# Patient Record
Sex: Female | Born: 1985 | Race: Black or African American | Hispanic: No | Marital: Single | Smoking: Never smoker
Health system: Southern US, Community
[De-identification: ages and names within clinical notes are randomized; demographics above are authoritative.]

## PROBLEM LIST (undated history)

## (undated) DIAGNOSIS — R87810 Cervical high risk human papillomavirus (HPV) DNA test positive: Secondary | ICD-10-CM

## (undated) DIAGNOSIS — F419 Anxiety disorder, unspecified: Secondary | ICD-10-CM

## (undated) DIAGNOSIS — F32A Depression, unspecified: Secondary | ICD-10-CM

## (undated) DIAGNOSIS — R87619 Unspecified abnormal cytological findings in specimens from cervix uteri: Secondary | ICD-10-CM

## (undated) DIAGNOSIS — E079 Disorder of thyroid, unspecified: Secondary | ICD-10-CM

## (undated) DIAGNOSIS — A64 Unspecified sexually transmitted disease: Secondary | ICD-10-CM

## (undated) DIAGNOSIS — N946 Dysmenorrhea, unspecified: Secondary | ICD-10-CM

## (undated) DIAGNOSIS — L630 Alopecia (capitis) totalis: Secondary | ICD-10-CM

## (undated) DIAGNOSIS — F329 Major depressive disorder, single episode, unspecified: Secondary | ICD-10-CM

## (undated) DIAGNOSIS — D563 Thalassemia minor: Secondary | ICD-10-CM

## (undated) HISTORY — DX: Alopecia (capitis) totalis: L63.0

## (undated) HISTORY — DX: Depression, unspecified: F32.A

## (undated) HISTORY — DX: Cervical high risk human papillomavirus (HPV) DNA test positive: R87.810

## (undated) HISTORY — DX: Major depressive disorder, single episode, unspecified: F32.9

## (undated) HISTORY — DX: Thalassemia minor: D56.3

## (undated) HISTORY — DX: Unspecified sexually transmitted disease: A64

## (undated) HISTORY — DX: Disorder of thyroid, unspecified: E07.9

## (undated) HISTORY — DX: Unspecified abnormal cytological findings in specimens from cervix uteri: R87.619

## (undated) HISTORY — DX: Dysmenorrhea, unspecified: N94.6

---
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
## 2005-09-18 ENCOUNTER — Emergency Department (HOSPITAL_COMMUNITY): Admission: EM | Admit: 2005-09-18 | Discharge: 2005-09-19 | Payer: Self-pay | Admitting: Emergency Medicine

## 2005-09-24 ENCOUNTER — Other Ambulatory Visit: Admission: RE | Admit: 2005-09-24 | Discharge: 2005-09-24 | Payer: Self-pay | Admitting: Obstetrics and Gynecology

## 2006-01-01 ENCOUNTER — Other Ambulatory Visit: Admission: RE | Admit: 2006-01-01 | Discharge: 2006-01-01 | Payer: Self-pay | Admitting: Obstetrics & Gynecology

## 2006-03-04 ENCOUNTER — Inpatient Hospital Stay (HOSPITAL_COMMUNITY): Admission: AD | Admit: 2006-03-04 | Discharge: 2006-03-04 | Payer: Self-pay | Admitting: Obstetrics & Gynecology

## 2006-03-10 ENCOUNTER — Emergency Department (HOSPITAL_COMMUNITY): Admission: EM | Admit: 2006-03-10 | Discharge: 2006-03-10 | Payer: Self-pay | Admitting: Emergency Medicine

## 2006-03-10 ENCOUNTER — Encounter: Payer: Self-pay | Admitting: Obstetrics & Gynecology

## 2006-08-05 ENCOUNTER — Inpatient Hospital Stay (HOSPITAL_COMMUNITY): Admission: AD | Admit: 2006-08-05 | Discharge: 2006-08-05 | Payer: Self-pay | Admitting: Obstetrics and Gynecology

## 2006-08-06 ENCOUNTER — Encounter (INDEPENDENT_AMBULATORY_CARE_PROVIDER_SITE_OTHER): Payer: Self-pay | Admitting: Specialist

## 2006-08-06 ENCOUNTER — Inpatient Hospital Stay (HOSPITAL_COMMUNITY): Admission: AD | Admit: 2006-08-06 | Discharge: 2006-08-10 | Payer: Self-pay | Admitting: Obstetrics and Gynecology

## 2007-09-15 ENCOUNTER — Emergency Department (HOSPITAL_COMMUNITY): Admission: EM | Admit: 2007-09-15 | Discharge: 2007-09-15 | Payer: Self-pay | Admitting: Emergency Medicine

## 2007-09-17 HISTORY — PX: HERNIA REPAIR: SHX51

## 2008-11-13 ENCOUNTER — Emergency Department (HOSPITAL_COMMUNITY): Admission: EM | Admit: 2008-11-13 | Discharge: 2008-11-13 | Payer: Self-pay | Admitting: Emergency Medicine

## 2011-01-01 LAB — URINE CULTURE: Colony Count: >=100000

## 2011-01-01 LAB — POCT I-STAT, CHEM 8
BUN: 11 mg/dL (ref 6–23)
Chloride: 108 mEq/L (ref 96–112)
Creatinine, Ser: 0.8 mg/dL (ref 0.4–1.2)
Glucose, Bld: 99 mg/dL (ref 70–99)
Potassium: 3.4 mEq/L — ABNORMAL LOW (ref 3.5–5.1)
Sodium: 142 mEq/L (ref 135–145)

## 2011-01-01 LAB — COMPREHENSIVE METABOLIC PANEL
Albumin: 4.3 g/dL (ref 3.5–5.2)
Alkaline Phosphatase: 115 U/L (ref 39–117)
BUN: 10 mg/dL (ref 6–23)
Calcium: 9.7 mg/dL (ref 8.4–10.5)
Glucose, Bld: 104 mg/dL — ABNORMAL HIGH (ref 70–99)
Potassium: 3.4 mEq/L — ABNORMAL LOW (ref 3.5–5.1)
Total Protein: 7.9 g/dL (ref 6.0–8.3)

## 2011-01-01 LAB — DIFFERENTIAL
Lymphocytes Relative: 3 % — ABNORMAL LOW (ref 12–46)
Lymphs Abs: 0.3 10*3/uL — ABNORMAL LOW (ref 0.7–4.0)
Monocytes Absolute: 0.2 10*3/uL (ref 0.1–1.0)
Monocytes Relative: 1 % — ABNORMAL LOW (ref 3–12)
Neutro Abs: 11.7 10*3/uL — ABNORMAL HIGH (ref 1.7–7.7)
Neutrophils Relative %: 96 % — ABNORMAL HIGH (ref 43–77)

## 2011-01-01 LAB — CBC
HCT: 38.3 % (ref 36.0–46.0)
MCHC: 31.1 g/dL (ref 30.0–36.0)
Platelets: 237 10*3/uL (ref 150–400)
RDW: 15.7 % — ABNORMAL HIGH (ref 11.5–15.5)

## 2011-01-01 LAB — URINALYSIS, ROUTINE W REFLEX MICROSCOPIC
Glucose, UA: NEGATIVE mg/dL
Ketones, ur: >80 mg/dL — AB
Nitrite: NEGATIVE
Specific Gravity, Urine: 1.031 — ABNORMAL HIGH (ref 1.005–1.030)
pH: 6 (ref 5.0–8.0)

## 2011-01-01 LAB — POCT PREGNANCY, URINE: Preg Test, Ur: NEGATIVE

## 2011-01-01 LAB — URINE MICROSCOPIC-ADD ON

## 2011-02-01 NOTE — Discharge Summary (Signed)
Kristina Arnold, LIVECCHI           ACCOUNT NO.:  [id number]   MEDICAL RECORD NO.:  [id number]          PATIENT TYPE:  INP   LOCATION:  9119                          FACILITY:  [facility]   PHYSICIAN:  Kendra H. Tenny Craw, MD     DATE OF BIRTH:  [birth year]   DATE OF ADMISSION:  08/06/2006  DATE OF DISCHARGE:  08/10/2006                               DISCHARGE SUMMARY   FINAL DIAGNOSES:  1. Intrauterine pregnancy at 38-6/7th's weeks' gestation.  2. Non-reassuring fetal well being.  3. Maternal fever, probable chorioamnionitis.  4. Latent phase of labor and thick meconium-stained amniotic fluid.   PROCEDURE:  Primary low segment transverse cesarean section.  Surgeon:  Dr. Conley Simmonds.  Assistant:  Dr. Ilda Mori.   COMPLICATIONS:  None.     This 25 year old, G1, P0 presents at 38-6/7th's weeks' gestation with  increase in uterine contractions.  The patient had been seen in the  office on November20, 2007, with a complaint of fever and decreased  fetal movement.  The patient in the office had a BPP performed which was  8/8, but did have a nonreactive stress test and was sent to the hospital  for continuous monitoring.  In the hospital, the patient was had a CBC  performed with a white count of 26,000.  She was diagnosed with a viral  syndrome and sent home.  She returned early the next morning complaining  of increased contractions with increase in her temperature that would  decrease with Tylenol administration.  Up to this point in the pregnancy  though, the patient's antepartum course had been uncomplicated.  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  She is also known to be group B strep positive.  Otherwise,  she had an easy antepartum course up to this point.  Upon admission to  the hospital, the patient's cervix was only 1-to-2-cm dilated.  She had  a repeat  white blood cell count that was 28,000, and fetal heart rate  tracings that showed some fetal tachycardia with decreased beat to beat  variability.  She was sent down to labor and delivery for further  evaluation.  She received IV fluids, AROM was performed with thick  meconium-stained amniotic fluid noted.  IUPCs were placed.  At this  point, the fetal heart rate tracing had decreased variability and  nonreactive.  Secondary to this and being remote from labor, the  decision was made to proceed with a primary cesarean section.   The patient was taken to the operating room on November21,2007, by  Dr. Conley Simmonds, where a primary low transverse cesarean section was  performed with the delivery of a 7 pounds 4 ounces female infant with  Apgar's of 5 and 9, an arterial cord pH came back 7.26.  The delivery  went without complications.  The patient was continued on antibiotics  secondary to her chorioamnionitis and fevers.  The baby was sent to the  NICU for suspected sepsis.  Postoperatively,  the patient continued to  have fever which eventually started resolving.  Later on postoperative  day number zero, she was switched over to ampicillin and gentamicin.  On  postoperative day #1, white blood cell count was still elevated.  By  postoperative day #2, the patient was feeling much better.  White blood  cell count decreased back down to 23,000.  She was afebrile for 24  hours.  At this point, she was continued on antibiotics. By  postoperative day #3, she was doing well.  She did want her little boy  circumcised but at this point he was in the NICU, so that could not be  done at this point.  Her antibiotics were able to be discontinued.  She  was felt ready for discharge on postoperative day #2.   She did desire Depo-Provera injection before discharge.  This was given  to the patient.  She was given Percocet one to two every four to six  hours as needed for pain, told to continue on iron  supplement and to use  Colace as needed.  Precautions were reviewed and of course the patient  was to call with any increased bleeding, pain, or problems.  She was to  follow up in our office in four to six weeks.  The baby was still in  NICU at the time of discharge.   DISCHARGE LABORATORY:  The patient had a final hemoglobin of 8.2, which  was down from a preoperative level of 10.2 and a white blood cell count  of 17,000 which was down from a high of 31,000 on the 22nd, and  platelets of 263,000.      Leilani Able, P.A.-C.      Freddrick March. Tenny Craw, MD  Electronically Signed    MB/MEDQ  D:  09/13/2006  T:  09/13/2006  Job:  [id number]

## 2011-02-01 NOTE — Op Note (Signed)
NAMEVIRGILENE, STRYKER NO.:  [id number]   MEDICAL RECORD NO.:  [id number]          PATIENT TYPE:  INP   LOCATION:  9139                          FACILITY:  [facility]   PHYSICIAN:  Randye Lobo, M.D.   DATE OF BIRTH:  05/06/1986   DATE OF PROCEDURE:  08/06/2006  DATE OF DISCHARGE:                               OPERATIVE REPORT   PREOPERATIVE DIAGNOSES:  1. Intrauterine gestation at 38+6 weeks.  2. Nonreassuring fetal assessment.  3. Latent phase labor.  4. Thick meconium stained amniotic fluid.  5. Fever of unknown origin.   POSTOPERATIVE DIAGNOSES:  1. Intrauterine gestation at 38+6 weeks.  2. Nonreassuring fetal assessment.  3. Latent phase labor.  4. Thick meconium stained amniotic fluid.  5. Fever of unknown origin.   PROCEDURE:  Primary low segment transverse cesarean section.   SURGEON:  Randye Lobo, M.D.   ASSISTANT:  Ilda Mori, M.D.   ANESTHESIA:  Spinal.   ESTIMATED BLOOD LOSS:  700 mL.   COMPLICATIONS:  None.   INDICATIONS FOR PROCEDURE:  The patient is a 25 year old gravida 1, para  0 African American female at 38+[redacted] weeks gestation ([facility] August 14, 2006) who presented with uterine contractions.  The patient had been  seen in the office on August 05, 2006, with a complaint of fever to  101.4 along with a complaint of decreased fetal movement.  The patient,  in the office, had a nonreactive nonstress test and she had a  biophysical profile which was 8/8.  The patient was subsequently sent to  the [facility] for a CBC and she was noted to have a  white count of 26,000.  The patient was diagnosed with a viral syndrome  and was sent home.  The patient returned early in the morning on  August 06, 2006, with a complaint of contractions.  She continued to  report spiking fevers which would come and go with Tylenol  administration.  The patient was noted to have cervical dilation which  was 1-2 cm.  She had some  bloody show after her exam.  The patient had a  repeat white blood cell count which was 28,000.  The fetal heart rate  tracing showed fetal tachycardia with decreased beat to beat  variability.  The patient did have a positive group B strep status.   The patient was sent down to labor and delivery for further evaluation.  The patient did have an IV which had been placed in the maternity  admissions area and she received IV fluid boluses.  Once on labor and  delivery, artificial rupture of membranes was performed and thick  meconium stained amniotic fluid was appreciated.  An internal scalp  electrode and an IUPC were placed at this time.  After a short period of  time, it was apparent that the fetal heart rate tracing had decreased  beat to beat variability and was nonreactive.  Due to the stage of labor  being remote from delivery, a decision was made to go ahead and proceed  with a primary cesarean section based on recommendation of the  obstetrician.  Risks, benefits, and alternatives were reviewed.   FINDINGS:  A viable female was delivered at 8:36 a.m.  Apgars were 5 at  one minute and 9 at five minutes.  Weight 7 pounds 4 ounces.  Amniotic  fluid thick meconium.  Normal uterus, tubes and ovaries.  Placenta with  meconium staining.  Normal insertion of a three-vessel cord.  Arterial  cord pH 7.26.   SPECIMENS:  The placenta was sent to pathology.   DESCRIPTION OF PROCEDURE:  The patient was escorted from her labor and  delivery suite to the operating room.  The patient received a spinal  anesthetic.  Fetal heart tones were in the 160s prior to beginning the  procedure.   The patient was placed in the supine position with a left lateral tilt  and the abdomen was then sterilely prepped and a Foley catheter was  sterilely placed in the bladder.  She was sterilely draped.   A Pfannenstiel incision was created sharply with the scalpel.  The  incision was carried down to the fascia  using a scalpel and using  monopolar cautery.  The fascia was then incised in the midline.  The  incision was extended bilaterally with the Mayo scissors.  The rectus  muscles were separated from the fascia superiorly and inferiorly.  The  rectus muscles were sharply divided in the midline.  The parietal  peritoneum was then entered sharply after elevating it with two hemostat  clamps.   The lower uterine segment was exposed.  The bladder flap was created.  A  transverse lower uterine segment incision was created with a scalpel and  then entry into the uterine cavity was performed with a hemostat clamp.  Membranes were ruptured.  A bandage scissors was used to extend the  uterine incision bilaterally in an  upward fashion.   Thick meconium was appreciated.  A hand was inserted over the vertex  which was elevated.  A Mityvac was then placed over the fetal vertex and  with one attempt, the vertex was then delivered without difficulty.  The  nares and mouth were then suctioned with a DeLee.  The newborn began to  have a spontaneous cry.  The remainder of the newborn was delivered  after one nuchal cord was reduced.  The cord was doubly clamped and cut  and the newborn was carried over to the awaiting pediatricians.   The patient had just finished a dose of penicillin 5,000,000 units IV  prior to beginning the surgery.  The placenta was manually extracted and  sent to pathology.  The patient then received Pitocin 20 units IV.  The  uterus was wiped clean with a moistened lap pad.   The uterus was exteriorized for its closure.  It was closed with a  double layered closure of #1 chromic.  The first was a running locked  layer and the second was an imbricating layer.  The uterus was then  returned to the peritoneal cavity which was irrigated and suctioned.  There was some bleeding noted along the right apex of the uterine incision and this responded to a figure-of-eight suture of #1  chromic.   With good hemostasis, the abdomen was closed at this time.  The  peritoneum was closed with a running suture of 2-0 Vicryl.  The rectus  muscles were reapproximated with interrupted sutures of #1 chromic. The  fascia was closed with a running suture of 0 Vicryl.  The subcutaneous  layer was irrigated  and suctioned and made hemostatic with monopolar  cautery.  The skin was closed with staples and a sterile bandage was  placed over the incision.   This concludes the patient's procedure.  There were no complications.  All sponge, needle and instrument counts were correct.      Randye Lobo, M.D.  Electronically Signed     BES/MEDQ  D:  08/06/2006  T:  08/06/2006  Job:  [id number]

## 2011-06-21 LAB — URINE MICROSCOPIC-ADD ON

## 2011-06-21 LAB — URINALYSIS, ROUTINE W REFLEX MICROSCOPIC
Bilirubin Urine: NEGATIVE
Specific Gravity, Urine: 1.023
Urobilinogen, UA: 1
pH: 7

## 2011-06-21 LAB — URINE CULTURE

## 2011-09-14 ENCOUNTER — Emergency Department
Admission: EM | Admit: 2011-09-14 | Discharge: 2011-09-14 | Disposition: A | Discharge status: Home / Self Care | Payer: 59 | Source: Home / Self Care | Attending: Family Medicine | Admitting: Family Medicine

## 2011-09-14 DIAGNOSIS — M940 Chondrocostal junction syndrome [Tietze]: Secondary | ICD-10-CM

## 2011-09-14 DIAGNOSIS — R079 Chest pain, unspecified: Secondary | ICD-10-CM

## 2011-09-14 MED ORDER — HYDROCODONE-ACETAMINOPHEN 5-500 MG PO TABS: 1.0000 | ORAL_TABLET | Freq: Every evening | ORAL | Status: AC | PRN

## 2011-09-14 NOTE — ED Notes (Signed)
States she had a cold three weeks ago when she first noticed discomfort in chest.  States she feels pinching feeling in chest with deep breaths.  States it keeps her from sleeping at night

## 2011-09-14 NOTE — ED Provider Notes (Signed)
History     CSN: [id number]  Arrival date & time 09/14/11  1253   First MD Initiated Contact with Patient 09/14/11 1603      Chief Complaint  Patient presents with  . Chest Pain      HPI Comments: Patient complains of onset of anterior chest pain and tightness about a week ago.  The pain is worse with movement and deep inspiration.  She feels well otherwise. She reports that she had a cold about 3 weeks ago which is almost completely resolved except for occasional cough.  No pleuritic pain, and no fevers, chills, and sweats   The history is provided by the patient.    History reviewed. No pertinent past medical history.  Past Surgical History  Procedure Date  . Cesarean section 2007    History reviewed. No pertinent family history.  History  Substance Use Topics  . Smoking status: Never Smoker   . Smokeless tobacco: Not on file  . Alcohol Use: No    OB History    Grav Para Term Preterm Abortions TAB SAB Ect Mult Living                  Review of Systems No sore throat at present. + mild cough, improving No pleuritic pain, but has soreness in anterior chest No wheezing No nasal congestion No post-nasal drainage No sinus pain/pressure No itchy/red eyes No earache No hemoptysis No SOB No fever/chills No nausea No vomiting No abdominal pain No diarrhea No urinary symptoms No skin rashes No fatigue No myalgias No headache Used OTC meds without relief  Allergies  Review of patient's allergies indicates no known allergies.  Home Medications   Current Outpatient Rx  Name Route Sig Dispense Refill  . ESTRADIOL CYPIONATE 5 MG/ML IM OIL Intramuscular Inject into the muscle every 28 (twenty-eight) days.      Marland Kitchen HYDROCODONE-ACETAMINOPHEN 5-500 MG PO TABS Oral Take 1 tablet by mouth at bedtime as needed for pain. 10 tablet 0    BP 131/70  Pulse 90  Temp(Src) 98.7 F (37.1 C) (Oral)  Resp 20  Ht 5\' 5"  (1.651 m)  Wt 171 lb (77.565 kg)  BMI 28.46  kg/m2  SpO2 100%  Physical Exam Nursing notes and Vital Signs reviewed. Appearance:  Patient appears healthy, stated age, and in no acute distress Eyes:  Pupils are equal, round, and reactive to light and accomodation.  Extraocular movement is intact.  Conjunctivae are not inflamed  Ears:  Canals normal.  Tympanic membranes normal.  Nose:  Mildly congested turbinates.  No sinus tenderness.    Pharynx:  Normal Neck:  Supple.  Slightly tender shotty posterior nodes are palpated bilaterally  Lungs:  Clear to auscultation.  Breath sounds are equal.  Chest:  Distinct tenderness to palpation over the mid-sternum.  Heart:  Regular rate and rhythm without murmurs, rubs, or gallops.  Abdomen:  Nontender without masses or hepatosplenomegaly.  Bowel sounds are present.  No CVA or flank tenderness.  Extremities:  No edema.  No calf tenderness Skin:  No rash present.   ED Course  Procedures none  Labs Reviewed -  EKG:  No acute changes    1. Costochondritis, acute       MDM  Reassurance.  Rx for Lortab at bedtime. Given a Water quality scientist patient information and instruction sheet on topic costochondritis Begin Ibuprofen 200mg , 4 tabs every 8 hours with food.  Apply heating pad 2 or 3 times daily.  Donna Christen, MD 09/16/11 [phone]

## 2012-08-06 ENCOUNTER — Emergency Department (HOSPITAL_COMMUNITY)
Admission: EM | Admit: 2012-08-06 | Discharge: 2012-08-06 | Disposition: A | Discharge status: Home / Self Care | Payer: Self-pay | Attending: Emergency Medicine | Admitting: Emergency Medicine

## 2012-08-06 ENCOUNTER — Encounter (HOSPITAL_COMMUNITY): Payer: Self-pay

## 2012-08-06 DIAGNOSIS — IMO0002 Reserved for concepts with insufficient information to code with codable children: Secondary | ICD-10-CM | POA: Insufficient documentation

## 2012-08-06 DIAGNOSIS — A084 Viral intestinal infection, unspecified: Secondary | ICD-10-CM

## 2012-08-06 DIAGNOSIS — R0789 Other chest pain: Secondary | ICD-10-CM | POA: Insufficient documentation

## 2012-08-06 DIAGNOSIS — R197 Diarrhea, unspecified: Secondary | ICD-10-CM | POA: Insufficient documentation

## 2012-08-06 DIAGNOSIS — A088 Other specified intestinal infections: Secondary | ICD-10-CM | POA: Insufficient documentation

## 2012-08-06 LAB — URINE MICROSCOPIC-ADD ON

## 2012-08-06 LAB — URINALYSIS, ROUTINE W REFLEX MICROSCOPIC
Nitrite: NEGATIVE
Specific Gravity, Urine: 1.038 — ABNORMAL HIGH (ref 1.005–1.030)
Urobilinogen, UA: 0.2 mg/dL (ref 0.0–1.0)
pH: 6 (ref 5.0–8.0)

## 2012-08-06 LAB — POCT I-STAT, CHEM 8
Chloride: 108 mEq/L (ref 96–112)
Creatinine, Ser: 0.8 mg/dL (ref 0.50–1.10)
HCT: 39 % (ref 36.0–46.0)
Hemoglobin: 13.3 g/dL (ref 12.0–15.0)
Potassium: 3.2 mEq/L — ABNORMAL LOW (ref 3.5–5.1)
Sodium: 142 mEq/L (ref 135–145)

## 2012-08-06 LAB — POCT PREGNANCY, URINE: Preg Test, Ur: NEGATIVE

## 2012-08-06 MED ORDER — DIPHENOXYLATE-ATROPINE 2.5-0.025 MG PO TABS: 1.0000 | ORAL_TABLET | Freq: Four times a day (QID) | ORAL | Status: DC | PRN

## 2012-08-06 MED ORDER — SODIUM CHLORIDE 0.9 % IV BOLUS (SEPSIS)
1000.0000 mL | Freq: Once | INTRAVENOUS | Status: AC
  Administered 2012-08-06: 1000 mL via INTRAVENOUS

## 2012-08-06 MED ORDER — ONDANSETRON 8 MG PO TBDP: 8.0000 mg | ORAL_TABLET | Freq: Three times a day (TID) | ORAL | Status: DC | PRN

## 2012-08-06 MED ORDER — SODIUM CHLORIDE 0.9 % IV SOLN
INTRAVENOUS | Status: DC
  Administered 2012-08-06: 20:00:00 via INTRAVENOUS

## 2012-08-06 MED ORDER — ONDANSETRON HCL 4 MG/2ML IJ SOLN
4.0000 mg | Freq: Once | INTRAMUSCULAR | Status: AC
  Administered 2012-08-06: 4 mg via INTRAVENOUS
  Filled 2012-08-06: qty 2

## 2012-08-06 MED ORDER — POTASSIUM CHLORIDE CRYS ER 20 MEQ PO TBCR
40.0000 meq | EXTENDED_RELEASE_TABLET | Freq: Once | ORAL | Status: AC
  Administered 2012-08-06: 40 meq via ORAL
  Filled 2012-08-06: qty 2

## 2012-08-06 NOTE — ED Provider Notes (Signed)
History     CSN: [id number]  Arrival date & time 08/06/12  1321   First MD Initiated Contact with Patient 08/06/12 1630      Chief Complaint  Patient presents with  . Emesis  . Diarrhea  . Chest Pain    (Consider location/radiation/quality/duration/timing/severity/associated sxs/prior treatment) Patient is a 26 y.o. female presenting with vomiting, diarrhea, and chest pain. The history is provided by the patient.  Emesis  Associated symptoms include diarrhea.  Diarrhea The primary symptoms include vomiting and diarrhea.  Chest Pain Primary symptoms include vomiting.    patient here with nonbilious vomiting watery diarrhea since yesterday. Denies any fever. No abdominal pain. No urinary symptoms. No cough but has had some chronic chest pain times many months which is been sharp and intermittent for which she has been evaluated for the past. Denies any leg pain or swelling. Denies any sick exposures. No medications taken prior to arrival and nothing makes her symptoms better worse. Denies any syncope or near-syncope does note some diffuse body weakness  History reviewed. No pertinent past medical history.  Past Surgical History  Procedure Date  . Cesarean section 2007  . Cesarean section umbilical hernia repa  . Hernia repair     No family history on file.  History  Substance Use Topics  . Smoking status: Never Smoker   . Smokeless tobacco: Never Used  . Alcohol Use: No    OB History    Grav Para Term Preterm Abortions TAB SAB Ect Mult Living                  Review of Systems  Cardiovascular: Positive for chest pain.  Gastrointestinal: Positive for vomiting and diarrhea.  All other systems reviewed and are negative.    Allergies  Review of patient's allergies indicates no known allergies.  Home Medications   Current Outpatient Rx  Name  Route  Sig  Dispense  Refill  . EMETROL 1.87-1.87-21.5 PO SOLN   Oral   Take 5 mLs by mouth every 15 (fifteen)  minutes as needed. nausea         . MEDROXYPROGESTERONE ACETATE 150 MG/ML IM SUSP   Intramuscular   Inject 150 mg into the muscle every 3 (three) months.           BP 124/67  Pulse 106  Temp 99.7 F (37.6 C) (Oral)  Resp 16  SpO2 100%  Physical Exam  Nursing note and vitals reviewed. Constitutional: She is oriented to person, place, and time. She appears well-developed and well-nourished.  Non-toxic appearance. No distress.  HENT:  Head: Normocephalic and atraumatic.  Eyes: Conjunctivae normal, EOM and lids are normal. Pupils are equal, round, and reactive to light.  Neck: Normal range of motion. Neck supple. No tracheal deviation present. No mass present.  Cardiovascular: Regular rhythm and normal heart sounds.  Tachycardia present.  Exam reveals no gallop.   No murmur heard. Pulmonary/Chest: Effort normal and breath sounds normal. No stridor. No respiratory distress. She has no decreased breath sounds. She has no wheezes. She has no rhonchi. She has no rales.  Abdominal: Soft. Normal appearance and bowel sounds are normal. She exhibits no distension. There is no tenderness. There is no rigidity, no rebound, no guarding and no CVA tenderness.  Musculoskeletal: Normal range of motion. She exhibits no edema and no tenderness.  Neurological: She is alert and oriented to person, place, and time. She has normal strength. No cranial nerve deficit or sensory deficit. GCS eye  subscore is 4. GCS verbal subscore is 5. GCS motor subscore is 6.  Skin: Skin is warm and dry. No abrasion and no rash noted.  Psychiatric: She has a normal mood and affect. Her speech is normal and behavior is normal.    ED Course  Procedures (including critical care time)  Labs Reviewed - No data to display No results found.   No diagnosis found.    MDM   Date: 08/06/2012  Rate: 112  Rhythm: sinus tachycardia  QRS Axis: normal  Intervals: normal  ST/T Wave abnormalities: normal  Conduction  Disutrbances:none  Narrative Interpretation:   Old EKG Reviewed: none available   7:47 PM Patient given IV fluids here and feels better. Suspect she has viral illness is to for discharge       Toy Baker, MD 08/06/12 1948

## 2012-08-06 NOTE — ED Notes (Addendum)
Patient reports that she has been having vomiting and diarrhea since yesterday. Patient denies any abdominal pain at this time. Patient also reports a temperature of 100.8 orally. Patient states she has vomited 20 times and 10 diarrheal stools in the past 24 hours. Patient denies seeing any blood in the stool or vomitus. Patient reports that she also has had left sided CP x 1 month with SOB.

## 2012-08-07 LAB — URINE CULTURE

## 2012-09-29 ENCOUNTER — Ambulatory Visit: Payer: 59 | Admitting: Family Medicine

## 2013-07-22 ENCOUNTER — Encounter (HOSPITAL_COMMUNITY): Payer: Self-pay | Admitting: Emergency Medicine

## 2013-07-22 ENCOUNTER — Emergency Department (HOSPITAL_COMMUNITY)
Admission: EM | Admit: 2013-07-22 | Discharge: 2013-07-22 | Disposition: A | Discharge status: Home / Self Care | Payer: BC Managed Care – PPO | Attending: Emergency Medicine | Admitting: Emergency Medicine

## 2013-07-22 DIAGNOSIS — F419 Anxiety disorder, unspecified: Secondary | ICD-10-CM

## 2013-07-22 DIAGNOSIS — F411 Generalized anxiety disorder: Secondary | ICD-10-CM | POA: Insufficient documentation

## 2013-07-22 DIAGNOSIS — R030 Elevated blood-pressure reading, without diagnosis of hypertension: Secondary | ICD-10-CM | POA: Insufficient documentation

## 2013-07-22 DIAGNOSIS — Z862 Personal history of diseases of the blood and blood-forming organs and certain disorders involving the immune mechanism: Secondary | ICD-10-CM | POA: Insufficient documentation

## 2013-07-22 DIAGNOSIS — Z8639 Personal history of other endocrine, nutritional and metabolic disease: Secondary | ICD-10-CM | POA: Insufficient documentation

## 2013-07-22 HISTORY — DX: Anxiety disorder, unspecified: F41.9

## 2013-07-22 LAB — POCT I-STAT, CHEM 8
Chloride: 103 mEq/L (ref 96–112)
HCT: 42 % (ref 36.0–46.0)
Hemoglobin: 14.3 g/dL (ref 12.0–15.0)
Potassium: 3.7 mEq/L (ref 3.5–5.1)
Sodium: 144 mEq/L (ref 135–145)

## 2013-07-22 MED ORDER — LORAZEPAM 1 MG PO TABS
1.0000 mg | ORAL_TABLET | Freq: Once | ORAL | Status: AC
  Administered 2013-07-22: 1 mg via ORAL
  Filled 2013-07-22: qty 1

## 2013-07-22 NOTE — Progress Notes (Signed)
Patient confirms her pcp is Dr. Lew Dawes in [facility].  System updated.

## 2013-07-22 NOTE — ED Provider Notes (Signed)
Medical screening examination/treatment/procedure(s) were performed by non-physician practitioner and as supervising physician I was immediately available for consultation/collaboration.  Flint Melter, MD 07/22/13 [phone]

## 2013-07-22 NOTE — ED Notes (Signed)
Patient reports that she has been feeling anxious, having sleepless nights, feeling jittery x 2 weeks patient reports that she saw her PCP 2 weeks ago and was prescribed anti anxiety meds, but never took any. Patient state she was hoping the feeling would go away. Patient states she has been having a lot of stress.

## 2013-07-22 NOTE — ED Provider Notes (Signed)
CSN: [id number]     Arrival date & time 07/22/13  1630 History   First MD Initiated Contact with Patient 07/22/13 1649     Chief Complaint  Patient presents with  . Anxiety   (Consider location/radiation/quality/duration/timing/severity/associated sxs/prior Treatment) HPI Comments: Patient presents emergency department with chief complaint of anxiety. She states that for the past month, the patient has felt jittery and anxious. She states that she has had increased stress in her life. She states that she went and saw her primary care provider approximately 2 weeks ago, and was prescribed a medication for anxiety. Patient states that she never filled the medicine, because she thought the symptoms would pass on her own. She also states that she is concerned because she went to [facility] today and her blood pressure was a little elevated. Also states that she has a history of low potassium, and is concerned that her potassium was low. She does not have any other health problems. She is not in any apparent distress.  The history is provided by the patient. No language interpreter was used.    Past Medical History  Diagnosis Date  . Anxiety    Past Surgical History  Procedure Laterality Date  . Cesarean section  2007  . Cesarean section  umbilical hernia repa  . Hernia repair     Family History  Problem Relation Age of Onset  . Anxiety disorder Mother   . Thyroid disease Mother   . Hypertension Mother   . Thyroid disease Father    History  Substance Use Topics  . Smoking status: Never Smoker   . Smokeless tobacco: Never Used  . Alcohol Use: No   OB History   Grav Para Term Preterm Abortions TAB SAB Ect Mult Living                 Review of Systems  All other systems reviewed and are negative.    Allergies  Review of patient's allergies indicates no known allergies.  Home Medications  No current outpatient prescriptions on file. BP 155/83  Pulse 129  Temp(Src) 98.6 F (37 C)  (Oral)  Resp 18  Ht 5\' 6"  (1.676 m)  Wt 190 lb (86.183 kg)  BMI 30.68 kg/m2  SpO2 100% Physical Exam  Nursing note and vitals reviewed. Constitutional: She is oriented to person, place, and time. She appears well-developed and well-nourished.  HENT:  Head: Normocephalic and atraumatic.  Eyes: Conjunctivae and EOM are normal. Pupils are equal, round, and reactive to light.  Neck: Normal range of motion. Neck supple.  Cardiovascular: Normal rate and regular rhythm.  Exam reveals no gallop and no friction rub.   No murmur heard. Pulmonary/Chest: Effort normal and breath sounds normal. No respiratory distress. She has no wheezes. She has no rales. She exhibits no tenderness.  Abdominal: Soft. Bowel sounds are normal. She exhibits no distension and no mass. There is no tenderness. There is no rebound and no guarding.  Musculoskeletal: Normal range of motion. She exhibits no edema and no tenderness.  Neurological: She is alert and oriented to person, place, and time.  Skin: Skin is warm and dry.  Psychiatric: She has a normal mood and affect. Her behavior is normal. Judgment and thought content normal.    ED Course  Procedures (including critical care time) Results for orders placed during the hospital encounter of 07/22/13  POCT I-STAT, CHEM 8      Result Value Range   Sodium 144  135 - 145 mEq/L  Potassium 3.7  3.5 - 5.1 mEq/L   Chloride 103  96 - 112 mEq/L   BUN 7  6 - 23 mg/dL   Creatinine, Ser 1.61  0.50 - 1.10 mg/dL   Glucose, Bld 83  70 - 99 mg/dL   Calcium, Ion 0.96 (*) 1.12 - 1.23 mmol/L   TCO2 26  0 - 100 mmol/L   Hemoglobin 14.3  12.0 - 15.0 g/dL   HCT 04.5  40.9 - 81.1 %     EKG Interpretation   None       MDM   1. Anxiety    Patient with anxiety.  EKG is normal. Chem 8 normal.  Improved with ativan.  Not in any apparent distress.  Patient discussed with Dr. Effie Shy.  Patient is stable and ready for discharge.  Filed Vitals:   07/22/13 1810  BP: 125/68   Pulse: 97  Temp:   Resp: [id number] PA-C 07/22/13 1821  Roxy Horseman, PA-C 07/22/13 1821

## 2013-11-10 ENCOUNTER — Other Ambulatory Visit: Payer: Self-pay

## 2014-12-01 ENCOUNTER — Ambulatory Visit (INDEPENDENT_AMBULATORY_CARE_PROVIDER_SITE_OTHER): Payer: BC Managed Care – PPO | Admitting: Obstetrics and Gynecology

## 2014-12-01 ENCOUNTER — Other Ambulatory Visit: Payer: Self-pay | Admitting: Obstetrics and Gynecology

## 2014-12-01 ENCOUNTER — Encounter: Payer: Self-pay | Admitting: Obstetrics and Gynecology

## 2014-12-01 VITALS — BP 122/64 | HR 100 | Resp 16 | Ht 65.5 in | Wt 185.6 lb

## 2014-12-01 DIAGNOSIS — Z862 Personal history of diseases of the blood and blood-forming organs and certain disorders involving the immune mechanism: Secondary | ICD-10-CM

## 2014-12-01 DIAGNOSIS — Z708 Other sex counseling: Secondary | ICD-10-CM

## 2014-12-01 DIAGNOSIS — Z0000 Encounter for general adult medical examination without abnormal findings: Secondary | ICD-10-CM | POA: Diagnosis not present

## 2014-12-01 DIAGNOSIS — Z01419 Encounter for gynecological examination (general) (routine) without abnormal findings: Secondary | ICD-10-CM

## 2014-12-01 LAB — POCT URINALYSIS DIPSTICK
Bilirubin, UA: NEGATIVE
GLUCOSE UA: NEGATIVE
KETONES UA: NEGATIVE
LEUKOCYTES UA: NEGATIVE
NITRITE UA: NEGATIVE
Protein, UA: NEGATIVE
UROBILINOGEN UA: NEGATIVE
pH, UA: 5

## 2014-12-01 LAB — HEMOGLOBIN, FINGERSTICK: HEMOGLOBIN, FINGERSTICK: 11.4 g/dL — AB (ref 12.0–16.0)

## 2014-12-01 LAB — COMPREHENSIVE METABOLIC PANEL
ALBUMIN: 4.3 g/dL (ref 3.5–5.2)
ALK PHOS: 80 U/L (ref 39–117)
ALT: 10 U/L (ref 0–35)
AST: 12 U/L (ref 0–37)
BUN: 6 mg/dL (ref 6–23)
CHLORIDE: 103 meq/L (ref 96–112)
CO2: 25 mEq/L (ref 19–32)
Calcium: 9.5 mg/dL (ref 8.4–10.5)
Creat: 0.79 mg/dL (ref 0.50–1.10)
GLUCOSE: 78 mg/dL (ref 70–99)
POTASSIUM: 3.7 meq/L (ref 3.5–5.3)
Sodium: 139 mEq/L (ref 135–145)
TOTAL PROTEIN: 7.2 g/dL (ref 6.0–8.3)
Total Bilirubin: 0.2 mg/dL (ref 0.2–1.2)

## 2014-12-01 LAB — LIPID PANEL
CHOL/HDL RATIO: 4.1 ratio
CHOLESTEROL: 124 mg/dL (ref 0–200)
HDL: 30 mg/dL — AB (ref >=46)
LDL Cholesterol: 71 mg/dL (ref 0–99)
TRIGLYCERIDES: 113 mg/dL (ref <150)
VLDL: 23 mg/dL (ref 0–40)

## 2014-12-01 LAB — CBC
HCT: 36.8 % (ref 36.0–46.0)
Hemoglobin: 11.3 g/dL — ABNORMAL LOW (ref 12.0–15.0)
MCH: 20.8 pg — ABNORMAL LOW (ref 26.0–34.0)
MCHC: 30.7 g/dL (ref 30.0–36.0)
MCV: 67.6 fL — ABNORMAL LOW (ref 78.0–100.0)
MPV: 10.7 fL (ref 8.6–12.4)
Platelets: 360 10*3/uL (ref 150–400)
RBC: 5.44 MIL/uL — AB (ref 3.87–5.11)
RDW: 16.2 % — AB (ref 11.5–15.5)
WBC: 8.7 10*3/uL (ref 4.0–10.5)

## 2014-12-01 LAB — HEPATITIS C ANTIBODY: HCV Ab: NEGATIVE

## 2014-12-01 LAB — TSH: TSH: 0.036 u[IU]/mL — AB (ref 0.350–4.500)

## 2014-12-01 LAB — IRON: Iron: 38 ug/dL — ABNORMAL LOW (ref 42–145)

## 2014-12-01 NOTE — Progress Notes (Signed)
Patient ID: Kristina Arnold, female   DOB: [birth year], 29 y.o.   MRN: [id number] 29 y.o. G1P1001 SingleAfrican AmericanF here for annual exam.    Son is now 41 years old.  C/S for chorioamnionitis and meconium.   Wants reliable birth control.  With OCPs, had palpitations.  Stopped Depo Provera.  Wants general blood work and STD testing.  History of low potassium and anemia.  Asking for thyroid testing as well.   PCP:  Jamal Collin, PA-C -  prescribes Lexapro   Patient's last menstrual period was 11/24/2014 (exact date).          Sexually active: Yes.    The current method of family planning is condoms sometimes.    Exercising: No.  none. Smoker:  no  Health Maintenance: Pap:  11-10-13 normal History of abnormal Pap:  Yes, 2009/2010 hx LGSIL.  Had colposcopy which revealed CIN II.  No treatment to cervix.  Paps reverted to normal. MMG:  n/a Colonoscopy:  n/a BMD:   n/a TDaP:  2013 Screening Labs:  Hb today: 11.4, Urine today: Trace RBC's--see LMP(asymptomatic)   reports that she has never smoked. She has never used smokeless tobacco. She reports that she does not drink alcohol or use illicit drugs.  Past Medical History  Diagnosis Date  . Anxiety   . 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 Depression   . Dysmenorrhea   . 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 Hernia repair  2009  . Cesarean section  2007    Current Outpatient Prescriptions  Medication Sig Dispense Refill  . escitalopram (LEXAPRO) 10 MG tablet Take 1 tablet by mouth daily.  5   No current facility-administered medications for this visit.    Family History  Problem Relation Age of Onset  . Anxiety disorder Mother   . Thyroid disease Mother   . Hypertension Mother   . Thyroid  disease Father   . Cancer Paternal Grandfather 69    dec--colon a  . Seizures Maternal Aunt     epilepsy  . Diabetes Maternal Grandmother     ROS:  Pertinent items are noted in HPI.  Otherwise, a comprehensive ROS was negative.  Exam:   BP 122/64 mmHg  Pulse 100  Resp 16  Ht 5' 5.5" (1.664 m)  Wt 185 lb 9.6 oz (84.188 kg)  BMI 30.40 kg/m2  LMP 11/24/2014 (Exact Date)      Height: 5' 5.5" (166.4 cm)  Ht Readings from Last 3 Encounters:  12/01/14 5' 5.5" (1.664 m)  07/22/13  (1.676 m)  09/14/11  (1.651 m)    General appearance: alert, cooperative and appears stated age Head: Normocephalic, without obvious abnormality, atraumatic Neck: no adenopathy, supple, symmetrical, trachea midline and thyroid normal to inspection and palpation Lungs: clear to auscultation bilaterally Breasts: normal appearance, no masses or tenderness, Inspection negative, No nipple retraction or dimpling, No nipple discharge or bleeding, No axillary or supraclavicular adenopathy, Right infra-axillary sebaceous cyst vs. lipoma - 5 mm, nontender.  Heart: regular rate and rhythm Abdomen: soft, non-tender; bowel sounds normal; no masses,  no organomegaly Extremities: extremities normal, atraumatic, no cyanosis or edema Skin: Skin color, texture, turgor normal. No rashes or lesions Lymph nodes: Cervical, supraclavicular, and  axillary nodes normal. No abnormal inguinal nodes palpated Neurologic: Grossly normal   Pelvic: External genitalia:  no lesions              Urethra:  normal appearing urethra with no masses, tenderness or lesions              Bartholins and Skenes: normal                 Vagina: normal appearing vagina with normal color and discharge, no lesions              Cervix: no lesions              Pap taken: Yes.   Bimanual Exam:  Uterus:  normal size, contour, position, consistency, mobility, non-tender              Adnexa: normal adnexa and no mass, fullness, tenderness                 Chaperone was present for exam.  A:  Well Woman with normal exam History of CIN II.  Observational management.  Desire for STD testing.  History of Cesarean Section. History of anemia.   P:   Mammogram age 29.  pap smear and reflex HVP testing.  STD testing, CBC, cholesterol, CMP, iron.  Information on ParaGard and Mirena IUD.   Verbal and written information.  Patient will consider. return annually or prn

## 2014-12-01 NOTE — Patient Instructions (Signed)
Intrauterine Device Information An intrauterine device (IUD) is inserted into your uterus to prevent pregnancy. There are two types of IUDs available:   Copper IUD--This type of IUD is wrapped in copper wire and is placed inside the uterus. Copper makes the uterus and fallopian tubes produce a fluid that kills sperm. The copper IUD can stay in place for 10 years.  Hormone IUD--This type of IUD contains the hormone progestin (synthetic progesterone). The hormone thickens the cervical mucus and prevents sperm from entering the uterus. It also thins the uterine lining to prevent implantation of a fertilized egg. The hormone can weaken or kill the sperm that get into the uterus. One type of hormone IUD can stay in place for 5 years, and another type can stay in place for 3 years. Your health care provider will make sure you are a good candidate for a contraceptive IUD. Discuss with your health care provider the possible side effects.  ADVANTAGES OF AN INTRAUTERINE DEVICE  IUDs are highly effective, reversible, long acting, and low maintenance.   There are no estrogen-related side effects.   An IUD can be used when breastfeeding.   IUDs are not associated with weight gain.   The copper IUD works immediately after insertion.   The hormone IUD works right away if inserted within 7 days of your period starting. You will need to use a backup method of birth control for 7 days if the hormone IUD is inserted at any other time in your cycle.  The copper IUD does not interfere with your female hormones.   The hormone IUD can make heavy menstrual periods lighter and decrease cramping.   The hormone IUD can be used for 3 or 5 years.   The copper IUD can be used for 10 years. DISADVANTAGES OF AN INTRAUTERINE DEVICE  The hormone IUD can be associated with irregular bleeding patterns.   The copper IUD can make your menstrual flow heavier and more painful.   You may experience cramping and  vaginal bleeding after insertion.  Document Released: 08/06/2004 Document Revised: 05/05/2013 Document Reviewed: 02/21/2013 [facility] Patient Information 2015 [place], [place]. This information is not intended to replace advice given to you by your health care provider. Make sure you discuss any questions you have with your health care provider.  EXERCISE AND DIET:  We recommended that you start or continue a regular exercise program for good health. Regular exercise means any activity that makes your heart beat faster and makes you sweat.  We recommend exercising at least 30 minutes per day at least 3 days a week, preferably 4 or 5.  We also recommend a diet low in fat and sugar.  Inactivity, poor dietary choices and obesity can cause diabetes, heart attack, stroke, and kidney damage, among others.    ALCOHOL AND SMOKING:  Women should limit their alcohol intake to no more than 7 drinks/beers/glasses of wine (combined, not each!) per week. Moderation of alcohol intake to this level decreases your risk of breast cancer and liver damage. And of course, no recreational drugs are part of a healthy lifestyle.  And absolutely no smoking or even second hand smoke. Most people know smoking can cause heart and lung diseases, but did you know it also contributes to weakening of your bones? Aging of your skin?  Yellowing of your teeth and nails?  CALCIUM AND VITAMIN D:  Adequate intake of calcium and Vitamin D are recommended.  The recommendations for exact amounts of these supplements seem to  change often, but generally speaking 600 mg of calcium (either carbonate or citrate) and 800 units of Vitamin D per day seems prudent. Certain women may benefit from higher intake of Vitamin D.  If you are among these women, your doctor will have told you during your visit.    PAP SMEARS:  Pap smears, to check for cervical cancer or precancers,  have traditionally been done yearly, although recent scientific advances have shown  that most women can have pap smears less often.  However, every woman still should have a physical exam from her gynecologist every year. It will include a breast check, inspection of the vulva and vagina to check for abnormal growths or skin changes, a visual exam of the cervix, and then an exam to evaluate the size and shape of the uterus and ovaries.  And after 29 years of age, a rectal exam is indicated to check for rectal cancers. We will also provide age appropriate advice regarding health maintenance, like when you should have certain vaccines, screening for sexually transmitted diseases, bone density testing, colonoscopy, mammograms, etc.   MAMMOGRAMS:  All women over 29 years old should have a yearly mammogram. Many facilities now offer a "3D" mammogram, which may cost around $50 extra out of pocket. If possible,  we recommend you accept the option to have the 3D mammogram performed.  It both reduces the number of women who will be called back for extra views which then turn out to be normal, and it is better than the routine mammogram at detecting truly abnormal areas.    COLONOSCOPY:  Colonoscopy to screen for colon cancer is recommended for all women at age 29.  We know, you hate the idea of the prep.  We agree, BUT, having colon cancer and not knowing it is worse!!  Colon cancer so often starts as a polyp that can be seen and removed at colonscopy, which can quite literally save your life!  And if your first colonoscopy is normal and you have no family history of colon cancer, most women don't have to have it again for 10 years.  Once every ten years, you can do something that may end up saving your life, right?  We will be happy to help you get it scheduled when you are ready.  Be sure to check your insurance coverage so you understand how much it will cost.  It may be covered as a preventative service at no cost, but you should check your particular policy.

## 2014-12-02 LAB — GC/CHLAMYDIA PROBE AMP, URINE
Chlamydia, Swab/Urine, PCR: NEGATIVE
GC Probe Amp, Urine: NEGATIVE

## 2014-12-02 LAB — STD PANEL
HIV 1&2 Ab, 4th Generation: NONREACTIVE
Hepatitis B Surface Ag: NEGATIVE

## 2014-12-03 LAB — T4, FREE: FREE T4: 1.63 ng/dL (ref 0.80–1.80)

## 2014-12-05 LAB — IPS PAP TEST WITH REFLEX TO HPV

## 2014-12-06 LAB — T3: T3, Total: 155.2 ng/dL (ref 80.0–204.0)

## 2014-12-07 ENCOUNTER — Encounter: Payer: Self-pay | Admitting: Obstetrics and Gynecology

## 2014-12-07 ENCOUNTER — Telehealth: Payer: Self-pay

## 2014-12-07 NOTE — Telephone Encounter (Signed)
Spoke with patient. Patient received mychart message from Dr.Silva. Verbalizes understanding of results. Patient is scheduled to see her PCP on Monday 12/12/2014.  Routing to provider for final review. Patient agreeable to disposition. Will close encounter

## 2014-12-07 NOTE — Telephone Encounter (Signed)
-----   Message from Patton SallesBrook E Amundson C Silva, MD sent at 12/07/2014  6:33 AM EDT ----- Results to patient through My Chart.  Please call to be sure she receives my recommendation to follow up with her PCP.  Her TSH is quite low.  Her free T4 and T3 are normal. I want to be sure she does not have developing thyroid disease.

## 2014-12-08 ENCOUNTER — Other Ambulatory Visit: Payer: Self-pay | Admitting: Obstetrics and Gynecology

## 2014-12-08 DIAGNOSIS — R7989 Other specified abnormal findings of blood chemistry: Secondary | ICD-10-CM

## 2014-12-13 ENCOUNTER — Telehealth: Payer: Self-pay | Admitting: Obstetrics and Gynecology

## 2014-12-13 NOTE — Telephone Encounter (Signed)
Call to patient to confirm that she is aware of appt with Dr Talmage NapBalan on 05.13.2016 @ 0900. Patient aware.

## 2014-12-19 ENCOUNTER — Other Ambulatory Visit: Payer: Self-pay | Admitting: Obstetrics and Gynecology

## 2014-12-19 ENCOUNTER — Encounter: Payer: Self-pay | Admitting: Obstetrics and Gynecology

## 2014-12-19 MED ORDER — VALACYCLOVIR HCL 500 MG PO TABS: ORAL_TABLET | ORAL | Status: DC

## 2014-12-19 NOTE — Telephone Encounter (Signed)
Dr.Silva, patient requesting refill of Valtrex. Patient last seen for aex on 12/01/2014 for aex. Has history of HSV II. No current rx on file. Please review and advise.

## 2015-01-18 ENCOUNTER — Other Ambulatory Visit (HOSPITAL_COMMUNITY): Payer: Self-pay | Admitting: Internal Medicine

## 2015-01-18 DIAGNOSIS — E0590 Thyrotoxicosis, unspecified without thyrotoxic crisis or storm: Secondary | ICD-10-CM

## 2015-01-26 ENCOUNTER — Ambulatory Visit (HOSPITAL_COMMUNITY): Payer: BC Managed Care – PPO

## 2015-01-27 ENCOUNTER — Encounter (HOSPITAL_COMMUNITY): Payer: BC Managed Care – PPO

## 2015-02-02 ENCOUNTER — Encounter (HOSPITAL_COMMUNITY)
Admission: RE | Admit: 2015-02-02 | Discharge: 2015-02-02 | Disposition: A | Discharge status: Home / Self Care | Payer: BC Managed Care – PPO | Source: Ambulatory Visit | Attending: Internal Medicine | Admitting: Internal Medicine

## 2015-02-02 DIAGNOSIS — E0590 Thyrotoxicosis, unspecified without thyrotoxic crisis or storm: Secondary | ICD-10-CM | POA: Diagnosis present

## 2015-02-02 LAB — HCG, SERUM, QUALITATIVE: PREG SERUM: NEGATIVE

## 2015-02-02 MED ORDER — SODIUM IODIDE I 131 CAPSULE
15.2000 | Freq: Once | INTRAVENOUS | Status: AC | PRN
  Administered 2015-02-02: 15.2 via ORAL

## 2015-02-03 ENCOUNTER — Encounter (HOSPITAL_COMMUNITY)
Admission: RE | Admit: 2015-02-03 | Discharge: 2015-02-03 | Disposition: A | Discharge status: Home / Self Care | Payer: BC Managed Care – PPO | Source: Ambulatory Visit | Attending: Internal Medicine | Admitting: Internal Medicine

## 2015-02-03 DIAGNOSIS — E0590 Thyrotoxicosis, unspecified without thyrotoxic crisis or storm: Secondary | ICD-10-CM | POA: Insufficient documentation

## 2015-02-03 MED ORDER — SODIUM IODIDE I 131 CAPSULE: 15.2000 | Freq: Once | INTRAVENOUS | Status: AC | PRN

## 2015-02-03 MED ORDER — SODIUM PERTECHNETATE TC 99M INJECTION
10.0000 | Freq: Once | INTRAVENOUS | Status: AC | PRN
  Administered 2015-02-03: 10 via INTRAVENOUS

## 2015-02-07 ENCOUNTER — Other Ambulatory Visit: Payer: Self-pay | Admitting: Internal Medicine

## 2015-02-07 DIAGNOSIS — E041 Nontoxic single thyroid nodule: Secondary | ICD-10-CM

## 2015-02-08 ENCOUNTER — Encounter (HOSPITAL_COMMUNITY): Admission: RE | Admit: 2015-02-08 | Payer: BC Managed Care – PPO | Source: Ambulatory Visit

## 2015-02-09 ENCOUNTER — Ambulatory Visit
Admission: RE | Admit: 2015-02-09 | Discharge: 2015-02-09 | Disposition: A | Discharge status: Home / Self Care | Payer: BC Managed Care – PPO | Source: Ambulatory Visit | Attending: Internal Medicine | Admitting: Internal Medicine

## 2015-02-09 DIAGNOSIS — E041 Nontoxic single thyroid nodule: Secondary | ICD-10-CM

## 2015-02-15 ENCOUNTER — Other Ambulatory Visit: Payer: BC Managed Care – PPO

## 2015-02-16 ENCOUNTER — Other Ambulatory Visit: Payer: Self-pay | Admitting: Internal Medicine

## 2015-02-16 DIAGNOSIS — E041 Nontoxic single thyroid nodule: Secondary | ICD-10-CM

## 2015-02-22 ENCOUNTER — Ambulatory Visit
Admission: RE | Admit: 2015-02-22 | Discharge: 2015-02-22 | Disposition: A | Discharge status: Home / Self Care | Payer: BC Managed Care – PPO | Source: Ambulatory Visit | Attending: Internal Medicine | Admitting: Internal Medicine

## 2015-02-22 ENCOUNTER — Other Ambulatory Visit (HOSPITAL_COMMUNITY)
Admission: RE | Admit: 2015-02-22 | Discharge: 2015-02-22 | Disposition: A | Discharge status: Home / Self Care | Payer: BC Managed Care – PPO | Source: Ambulatory Visit | Attending: Interventional Radiology | Admitting: Interventional Radiology

## 2015-02-22 DIAGNOSIS — E041 Nontoxic single thyroid nodule: Secondary | ICD-10-CM

## 2015-03-28 ENCOUNTER — Telehealth: Payer: Self-pay | Admitting: Obstetrics and Gynecology

## 2015-03-28 NOTE — Telephone Encounter (Signed)
Patient would like to start on the depo provera.

## 2015-03-28 NOTE — Telephone Encounter (Signed)
Spoke with patient. Patient states that she would like to restart Depo at this time. Patient's LMP was 7/5-7/9. Advised patient to restart Depo will need to call office with first day of next menses. If menses starts over the weekend will need to call first thing Monday morning. Then injection will need to be given within first five days of cycle. Patient is agreeable and verbalizes understanding. Will call with next cycle to restart depo. Last aex 12/01/2014 with Dr.Silva.  Routing to provider for final review. Patient agreeable to disposition.

## 2015-03-28 NOTE — Telephone Encounter (Signed)
Ok to do Depo Provera 150 mg IM q 3 months until March 2017 when next annual exam is due.  I agree with waiting until next cycle to start.  Encounter closed.

## 2015-03-29 ENCOUNTER — Encounter: Payer: Self-pay | Admitting: Obstetrics and Gynecology

## 2015-03-30 ENCOUNTER — Encounter: Payer: Self-pay | Admitting: Obstetrics and Gynecology

## 2015-03-30 NOTE — Telephone Encounter (Signed)
Phone call to patient in response to My Chart message, left message to call back. Ask for triage nurse.

## 2015-03-31 ENCOUNTER — Other Ambulatory Visit (HOSPITAL_COMMUNITY): Payer: Self-pay | Admitting: Internal Medicine

## 2015-03-31 DIAGNOSIS — E0590 Thyrotoxicosis, unspecified without thyrotoxic crisis or storm: Secondary | ICD-10-CM

## 2015-04-18 ENCOUNTER — Encounter (HOSPITAL_COMMUNITY): Payer: BC Managed Care – PPO

## 2015-04-19 ENCOUNTER — Encounter (HOSPITAL_COMMUNITY): Payer: BC Managed Care – PPO

## 2015-04-28 ENCOUNTER — Encounter (HOSPITAL_COMMUNITY)
Admission: RE | Admit: 2015-04-28 | Discharge: 2015-04-28 | Disposition: A | Discharge status: Home / Self Care | Payer: BC Managed Care – PPO | Source: Ambulatory Visit | Attending: Internal Medicine | Admitting: Internal Medicine

## 2015-04-28 DIAGNOSIS — E0590 Thyrotoxicosis, unspecified without thyrotoxic crisis or storm: Secondary | ICD-10-CM

## 2015-04-28 LAB — HCG, SERUM, QUALITATIVE: Preg, Serum: NEGATIVE

## 2015-04-28 MED ORDER — SODIUM IODIDE I 131 CAPSULE
29.7000 | Freq: Once | INTRAVENOUS | Status: AC | PRN
  Administered 2015-04-28: 29.7 via ORAL

## 2015-07-10 ENCOUNTER — Ambulatory Visit
Admission: RE | Admit: 2015-07-10 | Discharge: 2015-07-10 | Disposition: A | Discharge status: Home / Self Care | Payer: BC Managed Care – PPO | Source: Ambulatory Visit | Attending: Internal Medicine | Admitting: Internal Medicine

## 2015-07-10 ENCOUNTER — Other Ambulatory Visit: Payer: Self-pay | Admitting: Internal Medicine

## 2015-07-10 DIAGNOSIS — R0781 Pleurodynia: Secondary | ICD-10-CM

## 2015-11-24 ENCOUNTER — Encounter: Payer: Self-pay | Admitting: Obstetrics and Gynecology

## 2015-11-24 ENCOUNTER — Ambulatory Visit (INDEPENDENT_AMBULATORY_CARE_PROVIDER_SITE_OTHER): Payer: BC Managed Care – PPO | Admitting: Obstetrics and Gynecology

## 2015-11-24 VITALS — BP 114/60 | HR 84 | Ht 65.5 in | Wt 170.2 lb

## 2015-11-24 DIAGNOSIS — N760 Acute vaginitis: Secondary | ICD-10-CM

## 2015-11-24 DIAGNOSIS — L630 Alopecia (capitis) totalis: Secondary | ICD-10-CM

## 2015-11-24 DIAGNOSIS — Z113 Encounter for screening for infections with a predominantly sexual mode of transmission: Secondary | ICD-10-CM | POA: Diagnosis not present

## 2015-11-24 NOTE — Patient Instructions (Signed)
The blood testing you may want to pursue is called hemoglobin electrophoresis.

## 2015-11-24 NOTE — Progress Notes (Signed)
Patient ID: Kristina SizerDestiney E Arnold, female   DOB: 01/11/1986, 30 y.o.   MRN: [id number] GYNECOLOGY  VISIT   HPI: 30 y.o.   Single  African American  female   G1P1001 with Patient's last menstrual period was 11/03/2015 (exact date).   here for vaginal discharge and itching.    Patient is worried about chronic yeast infections.  Wants testing for this.   Wants to test for bacterial problems in her intestinal tract.   Struggling with hyperthyroidism.  Saw Dr. Sharl MaKerr.   Placed on Methimizole.  Then did radioactive iodine.  Has Hashimoto's thyroiditis.  Thyroid function is still normalizing.  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 [place] in [place].  Suggestion by her to treat with Zelgan.  Wants help with alopecia. [facility], but the waiting list is almost one year so she did not make an appointment.   Has low vit D.   Took Vitd  50,000 and now 2000.   Level back up to 47.   Son's father passed away in MVA 3 weeks ago.  PCP - Amy Pernell DupreAdams - [facility] [place].  GYNECOLOGIC HISTORY: Patient's last menstrual period was 11/03/2015 (exact date). Contraception:None Menopausal hormone therapy: n/a Last mammogram: n/a Last pap smear: 12-01-14 Neg        OB History    Gravida Para Term Preterm AB TAB SAB Ectopic Multiple Living   1 1 1       1          Patient Active Problem List   Diagnosis Date Noted  . 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 Anxiety   . 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 Depression   . Dysmenorrhea   . 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 Thyroid disease     dx'd with Hashimoto's--sees Dr. Sharl MaKerr.  Status post radioactive iodine  . Alopecia totalis      Past Surgical History  Procedure Laterality Date  . Hernia repair  2009  . Cesarean section  2007    Current Outpatient Prescriptions  Medication Sig Dispense Refill  . ALPRAZolam (XANAX) 0.25 MG tablet Take 0.25 mg by mouth as needed.    . Cholecalciferol (VITAMIN D3) 2000 units capsule Take 1 capsule by mouth daily.    Marland Kitchen. escitalopram (LEXAPRO) 10 MG tablet Take 10 mg by mouth daily.    . valACYclovir (VALTREX) 500 MG tablet Take for 3 days as needed. 30 tablet 2   No current facility-administered medications for this visit.     ALLERGIES: Review of patient's allergies indicates no known allergies.  Family History  Problem Relation Age of Onset  . Anxiety disorder Mother   . Thyroid disease Mother   . Hypertension Mother   . Thyroid disease Father   . Cancer Paternal Grandfather [id number]    dec--colon a  . Seizures Maternal Aunt     epilepsy  . Diabetes Maternal Grandmother     Social History   Social History  . Marital Status: Single    Spouse Name: N/A  . Number of Children: N/A  . Years of Education: N/A   Occupational History  . Not on file.   Social History  Main Topics  . Smoking status: Never Smoker   . Smokeless tobacco: Never Used  . Alcohol Use: No  . Drug Use: No  . Sexual Activity:    Partners: Male    Birth Control/ Protection: None   Other Topics Concern  . Not on file   Social History Narrative    ROS:  Pertinent items are noted in HPI.  PHYSICAL EXAMINATION:    BP 114/60 mmHg  Pulse 84  Ht 5' 5.5" (1.664 m)  Wt 170 lb 3.2 oz (77.202 kg)  BMI 27.88 kg/m2  LMP 11/03/2015 (Exact Date)    General appearance: alert, cooperative and appears stated age   Pelvic: External genitalia:  no lesions              Urethra:  normal appearing urethra with no masses, tenderness or lesions              Bartholins and Skenes: normal                 Vagina: normal appearing vagina with normal color and discharge, no lesions              Cervix: no  lesions          Bimanual Exam:  Uterus:  normal size, contour, position, consistency, mobility, non-tender              Adnexa: normal adnexa and no mass, fullness, tenderness                Chaperone was present for exam.  ASSESSMENT  Chronic yeast vaginitis.  Alopecia totalis. Hashimoto's thyroiditis. Desire for STD testing.  Hx low vit D.  Stress from loss of prior partner.  PLAN  Affirm done.  Await results from this for treatment. STD testing today.  Extensive discussion regarding alopecia including Up to Date literature review of diagnosis, etiology, and treatment.  Patient is very appreciative and states that I am the first provider to give her written literature about this.  I have encouraged her to have a second opinion about this through one of the local universities, and have suggested a referral from her PCP into the [facility] system.  Her PCP is a provider of [facility].  If this is not possible, then I will make a referral for the patient.  We discussed testing of bacteria in the intestinal tract and how this needs to be specific if someone is having a particular problem such as diarrhea.  She understands now that the colon normally has bacterial flora.  An After Visit Summary was printed and given to the patient.  __25____ minutes face to face time of which over 50% was spent in counseling.

## 2015-11-25 LAB — STD PANEL
HEP B S AG: NEGATIVE
HIV 1&2 Ab, 4th Generation: NONREACTIVE

## 2015-11-25 LAB — GC/CHLAMYDIA PROBE AMP
CT Probe RNA: NOT DETECTED
GC Probe RNA: NOT DETECTED

## 2015-11-25 LAB — WET PREP BY MOLECULAR PROBE
CANDIDA SPECIES: NEGATIVE
GARDNERELLA VAGINALIS: NEGATIVE
TRICHOMONAS VAG: NEGATIVE

## 2015-11-25 LAB — ESTRADIOL: Estradiol: 189 pg/mL

## 2015-11-25 LAB — HEPATITIS C ANTIBODY: HCV Ab: NEGATIVE

## 2015-11-26 ENCOUNTER — Encounter: Payer: Self-pay | Admitting: Obstetrics and Gynecology

## 2015-11-26 DIAGNOSIS — L630 Alopecia (capitis) totalis: Secondary | ICD-10-CM | POA: Insufficient documentation

## 2015-11-27 LAB — TESTOSTERONE TOTAL,FREE,BIO, MALES
ALBUMIN: 4.3 g/dL (ref 3.6–5.1)
SEX HORMONE BINDING: 25 nmol/L (ref 17–124)
TESTOSTERONE BIOAVAILABLE: 9.4 ng/dL (ref 1.6–19.1)
TESTOSTERONE FREE: 4.8 pg/mL (ref 0.6–6.8)
Testosterone: 33 ng/dL

## 2015-12-14 ENCOUNTER — Encounter: Payer: Self-pay | Admitting: Obstetrics and Gynecology

## 2015-12-14 ENCOUNTER — Ambulatory Visit: Payer: BC Managed Care – PPO | Admitting: Obstetrics and Gynecology

## 2015-12-14 ENCOUNTER — Ambulatory Visit (INDEPENDENT_AMBULATORY_CARE_PROVIDER_SITE_OTHER): Payer: BC Managed Care – PPO | Admitting: Obstetrics and Gynecology

## 2015-12-14 VITALS — BP 100/58 | HR 84 | Resp 16 | Ht 65.5 in | Wt 163.6 lb

## 2015-12-14 DIAGNOSIS — Z01419 Encounter for gynecological examination (general) (routine) without abnormal findings: Secondary | ICD-10-CM | POA: Diagnosis not present

## 2015-12-14 NOTE — Progress Notes (Signed)
Patient ID: Kristina Arnold, female   DOB: [birth year], 30 y.o.   MRN: [id number] 30 y.o. G1P1001 Single African American female here for annual exam.    Declines contraception.   Seeing [facility] - Dr. Hermelinda Medicus.  Patient searching for the cause of her thyroiditis and alopecia.   Lost 32 pounds since November.  Is hyperthyroid and this is being monitored by Dr. Leslie Dales.   PCP:  [facility]   Patient's last menstrual period was 12/01/2015.     Period Cycle (Days): 30 Period Duration (Days): 3 Period Pattern: Regular Menstrual Flow:  (heavy x3, light x2) Menstrual Control: Tampon, Maxi pad Menstrual Control Change Freq (Hours): every 4 hrs on heaviest day     Sexually active: Yes.   female The current method of family planning is none.    Exercising: No.   Smoker:  no  Health Maintenance: Pap:  12-01-14 Neg History of abnormal Pap:  Yes, 2009/2010 hx LGSIL. Had colposcopy which revealed CIN II. No treatment to cervix. Paps reverted to normal. MMG:  n/a Colonoscopy:  n/a BMD:   n/a  Result  n/a TDaP:  2013 Gardasil:   no HIV:  Negative.  Hep C:  Negative.  Screening Labs:  Hb today: PCP, Urine today: unable to void   reports that she has never smoked. She has never used smokeless tobacco. She reports that she does not drink alcohol or use illicit drugs.  Past Medical History  Diagnosis Date  . Anxiety   . 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 Depression   . Dysmenorrhea   . 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 Thyroid disease     dx'd with Hashimoto's--sees Dr. Sharl Ma.  Status post radioactive iodine  . Alopecia totalis     Past Surgical History  Procedure Laterality Date  . Hernia repair  2009  . Cesarean section  2007    Current Outpatient Prescriptions  Medication Sig  Dispense Refill  . ALPRAZolam (XANAX) 0.25 MG tablet Take 0.25 mg by mouth as needed.    . Cholecalciferol (VITAMIN D3) 2000 units capsule Take 1 capsule by mouth daily.    Marland Kitchen escitalopram (LEXAPRO) 10 MG tablet Take 10 mg by mouth daily.    Marland Kitchen levothyroxine (SYNTHROID, LEVOTHROID) 100 MCG tablet Take 100 mcg by mouth daily before breakfast.    . valACYclovir (VALTREX) 500 MG tablet Take for 3 days as needed. 30 tablet 2   No current facility-administered medications for this visit.    Family History  Problem Relation Age of Onset  . Anxiety disorder Mother   . Thyroid disease Mother   . Hypertension Mother   . Thyroid disease Father   . Cancer Paternal Grandfather 40    dec--colon a  . Seizures Maternal Aunt     epilepsy  . Diabetes Maternal Grandmother     ROS:  Pertinent items are noted in HPI.  Otherwise, a comprehensive ROS was negative.  Exam:   BP 100/58 mmHg  Pulse 84  Resp 16  Ht 5' 5.5" (1.664 m)  Wt 163 lb 9.6 oz (74.208 kg)  BMI 26.80 kg/m2  LMP 12/01/2015    General appearance: alert, cooperative and appears stated age Head: Normocephalic, without obvious abnormality, atraumatic Neck: no adenopathy, supple, symmetrical, trachea midline and  thyroid normal to inspection and palpation Lungs: clear to auscultation bilaterally Breasts: normal appearance, no masses or tenderness.  Has 4 mm sebaceous cyst of the right axilla.  Heart: regular rate and rhythm Abdomen: incisions:  Yes.    Pfannenstiel , soft, non-tender; no masses, no organomegaly Extremities: extremities normal, atraumatic, no cyanosis or edema Skin: Skin color, texture, turgor normal. No rashes or lesions Lymph nodes: Cervical, supraclavicular, and axillary nodes normal. No abnormal inguinal nodes palpated Neurologic: Grossly normal  Pelvic: External genitalia:  no lesions.  Clitoral ring.               Urethra:  normal appearing urethra with no masses, tenderness or lesions              Bartholins  and Skenes: normal                 Vagina: normal appearing vagina with normal color and discharge, no lesions              Cervix: no lesions              Pap taken: No. Bimanual Exam:  Uterus:  normal size, contour, position, consistency, mobility, non-tender              Adnexa: normal adnexa and no mass, fullness, tenderness              Rectal exam: No..     Chaperone was present for exam.  Assessment:   Well woman visit with normal exam. Hx prior LGSIL.  Hx HSV II.  Alopecia.  Hashimoto's thyroiditis.   Currently hyperthyroid. Weight loss.  Plan: Yearly mammogram recommended after age 30.  Recommended self breast exam.  Pap and HR HPV as above. Discussed healthy diet, regular exercise program including cardiovascular and weight bearing exercise. Labs performed.  No..   See orders. Prescription medication(s) given.  No..  See orders. Follow up annually and prn.       After visit summary provided.

## 2015-12-14 NOTE — Patient Instructions (Signed)

## 2016-04-20 ENCOUNTER — Other Ambulatory Visit: Payer: Self-pay | Admitting: Obstetrics and Gynecology

## 2016-04-22 NOTE — Telephone Encounter (Signed)
Medication refill request: valACYclovir 500mg  Last AEX:  12/14/15 BS Next AEX: 12/19/16 Last MMG (if hormonal medication request): n/a Refill authorized: 12/19/14 #30 w/2 refills; today #30 w/2 refills?

## 2016-04-22 NOTE — Telephone Encounter (Signed)
Medication refill request: Valacyclovir Last AEX:  12/14/15 BS Next AEX: 12/19/16 BS Last MMG (if hormonal medication request): n/a Refill authorized: 12/19/14 #30 2R. Please advise. Thank you.

## 2016-06-21 ENCOUNTER — Telehealth: Payer: Self-pay | Admitting: Hematology

## 2016-06-21 ENCOUNTER — Encounter: Payer: Self-pay | Admitting: Hematology

## 2016-06-21 NOTE — Telephone Encounter (Signed)
Appt scheduled w/kale 11/7 at 11am. Pt aware to arrive 30 minutes early. Demographics verified. Letter mailed to the pt.

## 2016-06-26 ENCOUNTER — Telehealth: Payer: Self-pay | Admitting: Hematology

## 2016-06-26 NOTE — Telephone Encounter (Signed)
Pt cld to reschedule 11/7 appt w/Kale for a later time. Appt moved from 11am to 2pm.

## 2016-07-15 ENCOUNTER — Telehealth: Payer: Self-pay | Admitting: Hematology

## 2016-07-15 NOTE — Telephone Encounter (Signed)
Tc to reschedule appt. Appt scheduled w/Kale on 11/3 at 1230pm. Scheduled on this day because pt has blackout days at work that prevents her from taking time off. Voiced understanding. Spoke to HomeMelissa about the reason for the appt being scheduled on this date.

## 2016-07-19 ENCOUNTER — Encounter: Payer: Self-pay | Admitting: Hematology

## 2016-07-19 ENCOUNTER — Ambulatory Visit (HOSPITAL_BASED_OUTPATIENT_CLINIC_OR_DEPARTMENT_OTHER): Payer: BC Managed Care – PPO | Admitting: Hematology

## 2016-07-19 ENCOUNTER — Ambulatory Visit (HOSPITAL_BASED_OUTPATIENT_CLINIC_OR_DEPARTMENT_OTHER): Payer: BC Managed Care – PPO

## 2016-07-19 ENCOUNTER — Telehealth: Payer: Self-pay | Admitting: Hematology

## 2016-07-19 VITALS — BP 119/66 | HR 68 | Temp 98.9°F | Resp 17 | Ht 65.5 in | Wt 149.4 lb

## 2016-07-19 DIAGNOSIS — D751 Secondary polycythemia: Secondary | ICD-10-CM

## 2016-07-19 DIAGNOSIS — D509 Iron deficiency anemia, unspecified: Secondary | ICD-10-CM

## 2016-07-19 DIAGNOSIS — D500 Iron deficiency anemia secondary to blood loss (chronic): Secondary | ICD-10-CM

## 2016-07-19 LAB — CBC & DIFF AND RETIC
BASO%: 0.6 % (ref 0.0–2.0)
BASOS ABS: 0 10*3/uL (ref 0.0–0.1)
EOS ABS: 0.2 10*3/uL (ref 0.0–0.5)
EOS%: 2.9 % (ref 0.0–7.0)
HCT: 36.2 % (ref 34.8–46.6)
HEMOGLOBIN: 11.3 g/dL — AB (ref 11.6–15.9)
Immature Retic Fract: 3.3 % (ref 1.60–10.00)
LYMPH#: 2.1 10*3/uL (ref 0.9–3.3)
LYMPH%: 33.6 % (ref 14.0–49.7)
MCH: 20.8 pg — ABNORMAL LOW (ref 25.1–34.0)
MCHC: 31.1 g/dL — ABNORMAL LOW (ref 31.5–36.0)
MCV: 67 fL — ABNORMAL LOW (ref 79.5–101.0)
MONO#: 0.6 10*3/uL (ref 0.1–0.9)
MONO%: 9 % (ref 0.0–14.0)
NEUT%: 53.9 % (ref 38.4–76.8)
NEUTROS ABS: 3.3 10*3/uL (ref 1.5–6.5)
NRBC: 0 % (ref 0–0)
Platelets: 275 10*3/uL (ref 145–400)
RBC: 5.4 10*6/uL (ref 3.70–5.45)
RDW: 15 % — AB (ref 11.2–14.5)
RETIC %: 1.26 % (ref 0.70–2.10)
Retic Ct Abs: 68.04 10*3/uL (ref 33.70–90.70)
WBC: 6.2 10*3/uL (ref 3.9–10.3)

## 2016-07-19 LAB — COMPREHENSIVE METABOLIC PANEL
ALT: 11 U/L (ref 0–55)
AST: 14 U/L (ref 5–34)
Albumin: 4.1 g/dL (ref 3.5–5.0)
Alkaline Phosphatase: 78 U/L (ref 40–150)
Anion Gap: 9 mEq/L (ref 3–11)
BUN: 5.7 mg/dL — AB (ref 7.0–26.0)
CO2: 25 meq/L (ref 22–29)
Calcium: 9.5 mg/dL (ref 8.4–10.4)
Chloride: 105 mEq/L (ref 98–109)
Creatinine: 0.7 mg/dL (ref 0.6–1.1)
GLUCOSE: 84 mg/dL (ref 70–140)
POTASSIUM: 3.6 meq/L (ref 3.5–5.1)
Sodium: 139 mEq/L (ref 136–145)
Total Bilirubin: 0.31 mg/dL (ref 0.20–1.20)
Total Protein: 8.2 g/dL (ref 6.4–8.3)

## 2016-07-19 LAB — LACTATE DEHYDROGENASE: LDH: 148 U/L (ref 125–245)

## 2016-07-19 NOTE — Progress Notes (Signed)
Marland Kitchen.    HEMATOLOGY/ONCOLOGY CONSULTATION NOTE  Date of Service: 07/19/2016  Patient Care Team: Amie Leslee HomeSmith Adams, NP as PCP - General (Infectious Diseases)  CHIEF COMPLAINTS/PURPOSE OF CONSULTATION:   Iron deficiency Anemia  HISTORY OF PRESENTING ILLNESS:   Kristina Arnold is a wonderful 30 y.o. female who has been referred to us by Dr .Pernell DupreADAMS, Kathyrn DrownAMIE SMITH, NP for evaluation and management of Iron deficiency anemia.  Patient has a history of hypothyroidism status post RAI ablation in 2016, iron deficiency anemia, depression was referred to us for management management of iron deficiency anemia by her primary care physician.  Based on the outside labs with her primary care physician on 05/30/2016 the patient's hemoglobin was 10.4 with an MCV of 65 normal WBC count of 6.9k and normal platelet count of 242k. Technician's note suggested some target cells. Blood chemistries are within normal limits she had an iron saturation of 19% and the ferritin of 34.5. Hemoglobin electrophoresis was done which showed normal hemoglobin profile.  Patient is here for further evaluation and management. She notes no dizziness or lightheadedness no chest pain no overt dyspnea on exertion. She has been on oral iron (fusion) containing 65 mg of elemental iron once daily for only few weeks to months. No overt rectal bleeding, melena, hematochezia. Has been having heavier periods lasting about 6-7 days out of which one to 2 days are heavy. Denies requiring blood transfusions.   No family history of blood disorders   MEDICAL HISTORY:  Past Medical History:  Diagnosis Date  . 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 Alopecia totalis   . Anxiety   . Depression   . Dysmenorrhea   . 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 Thyroid disease    dx'd with  Hashimoto's--sees Dr. Sharl MaKerr.  Status post radioactive iodine    SURGICAL HISTORY: Past Surgical History:  Procedure Laterality Date  . CESAREAN SECTION  2007  . HERNIA REPAIR  2009    SOCIAL HISTORY: Social History   Social History  . Marital status: Single    Spouse name: N/A  . Number of children: N/A  . Years of education: N/A   Occupational History  . Not on file.   Social History Main Topics  . Smoking status: Never Smoker  . Smokeless tobacco: Never Used  . Alcohol use No  . Drug use: No  . Sexual activity: Yes    Partners: Male    Birth control/ protection: None   Other Topics Concern  . Not on file   Social History Narrative  . No narrative on file    FAMILY HISTORY: Family History  Problem Relation Age of Onset  . Anxiety disorder Mother   . Thyroid disease Mother   . Hypertension Mother   . Thyroid disease Father   . Cancer Paternal Grandfather [id number]    dec--colon a  . Seizures Maternal Aunt     epilepsy  . Diabetes Maternal Grandmother     ALLERGIES:  has No Known Allergies.  MEDICATIONS:  Current Outpatient Prescriptions  Medication Sig Dispense Refill  . Cholecalciferol (VITAMIN D3) 5000 units CAPS Take 1 capsule by mouth daily.    . Ferrous Sulfate (SLOW FE PO) Take by mouth daily.    Marland Kitchen. levothyroxine (SYNTHROID, LEVOTHROID) 100 MCG tablet Take 88 mcg  by mouth daily before breakfast.     . valACYclovir (VALTREX) 500 MG tablet Take 1 tablet (500 mg total) by mouth 2 (two) times daily. Take for 3 days as needed. 30 tablet 1   No current facility-administered medications for this visit.     REVIEW OF SYSTEMS:    10 Point review of Systems was done is negative except as noted above.  PHYSICAL EXAMINATION: ECOG PERFORMANCE STATUS: 1  . Vitals:   07/19/16 1306  BP: 119/66  Pulse: 68  Resp: 17  Temp: 98.9 F (37.2 C)   Filed Weights   07/19/16 1306  Weight: 149 lb 6.4 oz (67.8 kg)   .Body mass index is 24.48  kg/m.  GENERAL:alert, in no acute distress and comfortable SKIN: skin color, texture, turgor are normal, no rashes or significant lesions EYES: normal, conjunctiva are pink and non-injected, sclera clear OROPHARYNX:no exudate, no erythema and lips, buccal mucosa, and tongue normal  NECK: supple, no JVD, thyroid normal size, non-tender, without nodularity LYMPH:  no palpable lymphadenopathy in the cervical, axillary or inguinal LUNGS: clear to auscultation with normal respiratory effort HEART: regular rate & rhythm,  no murmurs and no lower extremity edema ABDOMEN: abdomen soft, non-tender, normoactive bowel sounds , No palpable hepatosplenomegaly  Musculoskeletal: no cyanosis of digits and no clubbing  PSYCH: alert & oriented x 3 with fluent speech NEURO: no focal motor/sensory deficits  LABORATORY DATA:  I have reviewed the data as listed  . CBC Latest Ref Rng & Units 07/19/2016 12/01/2014 12/01/2014  WBC 3.9 - 10.3 10e3/uL 6.2 8.7 -  Hemoglobin 11.6 - 15.9 g/dL 11.3(L) 11.3(L) 11.4(L)  Hematocrit 34.8 - 46.6 % 36.2 36.8 -  Platelets 145 - 400 10e3/uL 275 360 -   . CBC    Component Value Date/Time   WBC 6.2 07/19/2016 1535   WBC 8.7 12/01/2014 1457   RBC 5.40 07/19/2016 1535   RBC 5.44 (H) 12/01/2014 1457   HGB 11.3 (L) 07/19/2016 1535   HCT 36.2 07/19/2016 1535   PLT 275 07/19/2016 1535   MCV 67.0 (L) 07/19/2016 1535   MCH 20.8 (L) 07/19/2016 1535   MCH 20.8 (L) 12/01/2014 1457   MCHC 31.1 (L) 07/19/2016 1535   MCHC 30.7 12/01/2014 1457   RDW 15.0 (H) 07/19/2016 1535   LYMPHSABS 2.1 07/19/2016 1535   MONOABS 0.6 07/19/2016 1535   EOSABS 0.2 07/19/2016 1535   BASOSABS 0.0 07/19/2016 1535    . CMP Latest Ref Rng & Units 07/19/2016 12/01/2014 07/22/2013  Glucose 70 - 140 mg/dl 84 78 83  BUN 7.0 - 61.9 mg/dL 5.0(D) 6 7  Creatinine 0.6 - 1.1 mg/dL 0.7 3.26 7.12  Sodium 458 - 145 mEq/L 139 139 144  Potassium 3.5 - 5.1 mEq/L 3.6 3.7 3.7  Chloride 96 - 112 mEq/L - 103 103   CO2 22 - 29 mEq/L 25 25 -  Calcium 8.4 - 10.4 mg/dL 9.5 9.5 -  Total Protein 6.4 - 8.3 g/dL 8.2 7.2 -  Total Bilirubin 0.20 - 1.20 mg/dL 0.99 0.2 -  Alkaline Phos 40 - 150 U/L 78 80 -  AST 5 - 34 U/L 14 12 -  ALT 0 - 55 U/L 11 10 -   . Lab Results  Component Value Date   IRON 84 07/19/2016   TIBC 336 07/19/2016   IRONPCTSAT 25 07/19/2016   (Iron and TIBC)  Lab Results  Component Value Date   FERRITIN 25 07/19/2016   . Lab Results  Component Value  Date   LDH 148 07/19/2016       RADIOGRAPHIC STUDIES: I have personally reviewed the radiological images as listed and agreed with the findings in the report. No results found.  ASSESSMENT & PLAN:   30 year old African-American female with  1) significant RBC Microcytosis with mild Anemia. Patient's microcytosis appears to be out of proportion to her degree of anemia. Also her RBC count is elevated in proportion to Hgb levels suggesting relative polycythemia. These findings suggest the presence of a hemoglobinopathy in addition to some Iron deficiency. 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 PLAN - -Would recommend iron polysaccharide 150 mg by mouth twice daily with orange juice. (Would be tolerated better with meals). -We will also send out a hemoglobin electrophoresis (WNL) and alpha thalassemia gene testing (pending). -Take daily vitamin B complex   Return to care with Dr. Candise CheKale in 2 months with repeat labs. Earlier if any acute questions or concerns.   All of the patients questions were answered with apparent satisfaction. The patient knows to call the clinic with any problems, questions or concerns.  I spent 40 minutes counseling the patient face to face. The total time spent in the appointment was 45 minutes and more than 50% was on counseling and direct patient cares.    Wyvonnia LoraGautam Kale MD MS AAHIVMS [facility] Hematology/Oncology Physician [facility]  (Office):       [phone] (Work  cell):  [phone] (Fax):           [phone]  07/19/2016 1:59 PM

## 2016-07-19 NOTE — Patient Instructions (Signed)
-  Would recommend iron polysaccharide 150 mg by mouth twice daily with orange juice. (Would be tolerated better with meals). -We will get labs today to see where we stand and adjust iron replacement accordingly. We will also send out a hemoglobin electrophoresis and alpha thalassemia gene testing today. -We'll see you back in 2 months with repeat labs.

## 2016-07-19 NOTE — Telephone Encounter (Signed)
Appointments scheduled per 11/3 LOS. Patient given AVS report and calendar of future scheduled appointments. °

## 2016-07-22 ENCOUNTER — Encounter: Payer: Self-pay | Admitting: Hematology

## 2016-07-22 LAB — HEMOGLOBINOPATHY EVALUATION
HEMOGLOBIN A2 QUANTITATION: 2.1 % (ref 0.7–3.1)
HEMOGLOBIN F QUANTITATION: 0 % (ref 0.0–2.0)
HGB C: 0 %
HGB S: 0 %
Hgb A: 97.9 % (ref 94.0–98.0)

## 2016-07-22 LAB — FERRITIN: Ferritin: 25 ng/ml (ref 9–269)

## 2016-07-22 LAB — IRON AND TIBC
%SAT: 25 % (ref 21–57)
Iron: 84 ug/dL (ref 41–142)
TIBC: 336 ug/dL (ref 236–444)
UIBC: 252 ug/dL (ref 120–384)

## 2016-07-23 ENCOUNTER — Encounter: Payer: BC Managed Care – PPO | Admitting: Hematology

## 2016-07-23 ENCOUNTER — Other Ambulatory Visit: Payer: Self-pay | Admitting: *Deleted

## 2016-07-23 MED ORDER — POLYSACCHARIDE IRON COMPLEX 150 MG PO CAPS: 150.0000 mg | ORAL_CAPSULE | Freq: Two times a day (BID) | ORAL | 1 refills | Status: DC

## 2016-07-24 ENCOUNTER — Encounter: Payer: Self-pay | Admitting: Hematology

## 2016-07-31 LAB — ALPHA-THALASSEMIA GENOTYPR

## 2016-09-17 ENCOUNTER — Other Ambulatory Visit (HOSPITAL_BASED_OUTPATIENT_CLINIC_OR_DEPARTMENT_OTHER): Payer: BC Managed Care – PPO

## 2016-09-17 ENCOUNTER — Encounter: Payer: Self-pay | Admitting: Hematology

## 2016-09-17 ENCOUNTER — Ambulatory Visit (HOSPITAL_BASED_OUTPATIENT_CLINIC_OR_DEPARTMENT_OTHER): Payer: BC Managed Care – PPO | Admitting: Hematology

## 2016-09-17 VITALS — BP 155/57 | HR 69 | Temp 98.9°F | Resp 18 | Wt 153.8 lb

## 2016-09-17 DIAGNOSIS — D509 Iron deficiency anemia, unspecified: Secondary | ICD-10-CM | POA: Diagnosis not present

## 2016-09-17 DIAGNOSIS — D500 Iron deficiency anemia secondary to blood loss (chronic): Secondary | ICD-10-CM

## 2016-09-17 DIAGNOSIS — D563 Thalassemia minor: Secondary | ICD-10-CM

## 2016-09-17 LAB — CBC & DIFF AND RETIC
BASO%: 0.3 % (ref 0.0–2.0)
BASOS ABS: 0 10*3/uL (ref 0.0–0.1)
EOS ABS: 0.1 10*3/uL (ref 0.0–0.5)
EOS%: 2 % (ref 0.0–7.0)
HEMATOCRIT: 32.3 % — AB (ref 34.8–46.6)
HEMOGLOBIN: 10.4 g/dL — AB (ref 11.6–15.9)
IMMATURE RETIC FRACT: 2.1 % (ref 1.60–10.00)
LYMPH#: 2.2 10*3/uL (ref 0.9–3.3)
LYMPH%: 37.2 % (ref 14.0–49.7)
MCH: 21.4 pg — ABNORMAL LOW (ref 25.1–34.0)
MCHC: 32.2 g/dL (ref 31.5–36.0)
MCV: 66.3 fL — ABNORMAL LOW (ref 79.5–101.0)
MONO#: 0.6 10*3/uL (ref 0.1–0.9)
MONO%: 9.3 % (ref 0.0–14.0)
NEUT#: 3.1 10*3/uL (ref 1.5–6.5)
NEUT%: 51.2 % (ref 38.4–76.8)
Platelets: 232 10*3/uL (ref 145–400)
RBC: 4.87 10*6/uL (ref 3.70–5.45)
RDW: 14.8 % — ABNORMAL HIGH (ref 11.2–14.5)
RETIC %: 1.12 % (ref 0.70–2.10)
RETIC CT ABS: 54.54 10*3/uL (ref 33.70–90.70)
WBC: 6 10*3/uL (ref 3.9–10.3)

## 2016-09-17 NOTE — Progress Notes (Signed)
Marland Kitchen    HEMATOLOGY/ONCOLOGY CLINIC NOTE  Date of Service: 09/17/2016  Patient Care Team: Amie Leslee Home, NP as PCP - General (Infectious Diseases)  CHIEF COMPLAINTS/PURPOSE OF CONSULTATION:  F/u for Microcytic Anemia  HISTORY OF PRESENTING ILLNESS:   Kristina Arnold is a wonderful 31 y.o. female who has been referred to [place] by Dr .Pernell Dupre, Kathyrn Drown, NP for evaluation and management of Iron deficiency anemia.  Patient has a history of hypothyroidism status post RAI ablation in 2016, iron deficiency anemia, depression was referred to [place] for management management of iron deficiency anemia by her primary care physician.  Based on the outside labs with her primary care physician on 05/30/2016 the patient's hemoglobin was 10.4 with an MCV of 65 normal WBC count of 6.9k and normal platelet count of 242k. Technician's note suggested some target cells. Blood chemistries are within normal limits she had an iron saturation of 19% and the ferritin of 34.5. Hemoglobin electrophoresis was done which showed normal hemoglobin profile.  Patient is here for further evaluation and management. She notes no dizziness or lightheadedness no chest pain no overt dyspnea on exertion. She has been on oral iron (fusion) containing 65 mg of elemental iron once daily for only few weeks to months. No overt rectal bleeding, melena, hematochezia. Has been having heavier periods lasting about 6-7 days out of which one to 2 days are heavy. Denies requiring blood transfusions.   No family history of blood disorders  INTERVAL HISTORY  Patient is here for follow-up of her microcytic anemia. She notes that her energy levels are improved since his been on the iron polysaccharide 2 times daily and notes that her periods have been lighter. Ferritin levels have remained about the same at 27. She started oral iron without any issues. Since her microcytosis was out of proportion to her iron deficiency we did a hemoglobin  electrophoresis which was within normal limits however alpha thalassemia gene deletion studies show that she has alpha thalassemia trait which would be consistent with her excessive microcytosis. No other acute new symptoms. She is in good spirits.  MEDICAL HISTORY:  Past Medical History:  Diagnosis Date  . 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 Alopecia totalis   . Anxiety   . Depression   . Dysmenorrhea   . 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 Thyroid disease    dx'd with Hashimoto's--sees Dr. Sharl Ma.  Status post radioactive iodine    SURGICAL HISTORY: Past Surgical History:  Procedure Laterality Date  . CESAREAN SECTION  2007  . HERNIA REPAIR  2009    SOCIAL HISTORY: Social History   Social History  . Marital status: Single    Spouse name: N/A  . Number of children: N/A  . Years of education: N/A   Occupational History  . Not on file.   Social History Main Topics  . Smoking status: Never Smoker  . Smokeless tobacco: Never Used  . Alcohol use No  . Drug use: No  . Sexual activity: Yes    Partners: Male    Birth control/ protection: None   Other Topics Concern  . Not on file   Social History Narrative  . No narrative on file    FAMILY HISTORY: Family History  Problem Relation Age of Onset  . Anxiety disorder  Mother   . Thyroid disease Mother   . Hypertension Mother   . Thyroid disease Father   . Cancer Paternal Grandfather 1280    dec--colon a  . Seizures Maternal Aunt     epilepsy  . Diabetes Maternal Grandmother     ALLERGIES:  has No Known Allergies.  MEDICATIONS:  Current Outpatient Prescriptions  Medication Sig Dispense Refill  . Cholecalciferol (VITAMIN D3) 5000 units CAPS Take 1 capsule by mouth daily.    Marland Kitchen. escitalopram (LEXAPRO) 10 MG tablet Take 10 mg by mouth.    . iron  polysaccharides (NIFEREX) 150 MG capsule Take 1 capsule (150 mg total) by mouth 2 (two) times daily. Take with orange juice. 60 capsule 1  . levothyroxine (SYNTHROID, LEVOTHROID) 100 MCG tablet Take 88 mcg by mouth daily before breakfast.     . valACYclovir (VALTREX) 500 MG tablet Take 1 tablet (500 mg total) by mouth 2 (two) times daily. Take for 3 days as needed. (Patient taking differently: Take 500 mg by mouth as needed. Take for 3 days as needed.) 30 tablet 1   No current facility-administered medications for this visit.     REVIEW OF SYSTEMS:    10 Point review of Systems was done is negative except as noted above.  PHYSICAL EXAMINATION: ECOG PERFORMANCE STATUS: 1  . Vitals:   09/17/16 1525  BP: (!) 155/57  Pulse: 69  Resp: 18  Temp: 98.9 F (37.2 C)   Filed Weights   09/17/16 1525  Weight: 153 lb 12.8 oz (69.8 kg)   .Body mass index is 25.2 kg/m.  GENERAL:alert, in no acute distress and comfortable SKIN: skin color, texture, turgor are normal, no rashes or significant lesions EYES: normal, conjunctiva are pink and non-injected, sclera clear OROPHARYNX:no exudate, no erythema and lips, buccal mucosa, and tongue normal  NECK: supple, no JVD, thyroid normal size, non-tender, without nodularity LYMPH:  no palpable lymphadenopathy in the cervical, axillary or inguinal LUNGS: clear to auscultation with normal respiratory effort HEART: regular rate & rhythm,  no murmurs and no lower extremity edema ABDOMEN: abdomen soft, non-tender, normoactive bowel sounds , No palpable hepatosplenomegaly  Musculoskeletal: no cyanosis of digits and no clubbing  PSYCH: alert & oriented x 3 with fluent speech NEURO: no focal motor/sensory deficits  LABORATORY DATA:  I have reviewed the data as listed  . CBC Latest Ref Rng & Units 09/17/2016 07/19/2016 12/01/2014  WBC 3.9 - 10.3 10e3/uL 6.0 6.2 8.7  Hemoglobin 11.6 - 15.9 g/dL 10.4(L) 11.3(L) 11.3(L)  Hematocrit 34.8 - 46.6 % 32.3(L) 36.2  36.8  Platelets 145 - 400 10e3/uL 232 275 360   . CBC    Component Value Date/Time   WBC 6.0 09/17/2016 1504   WBC 8.7 12/01/2014 1457   RBC 4.87 09/17/2016 1504   RBC 5.44 (H) 12/01/2014 1457   HGB 10.4 (L) 09/17/2016 1504   HCT 32.3 (L) 09/17/2016 1504   PLT 232 09/17/2016 1504   MCV 66.3 (L) 09/17/2016 1504   MCH 21.4 (L) 09/17/2016 1504   MCH 20.8 (L) 12/01/2014 1457   MCHC 32.2 09/17/2016 1504   MCHC 30.7 12/01/2014 1457   RDW 14.8 (H) 09/17/2016 1504   LYMPHSABS 2.2 09/17/2016 1504   MONOABS 0.6 09/17/2016 1504   EOSABS 0.1 09/17/2016 1504   BASOSABS 0.0 09/17/2016 1504    . CMP Latest Ref Rng & Units 07/19/2016 12/01/2014 07/22/2013  Glucose 70 - 140 mg/dl 84 78 83  BUN 7.0 - 40.926.0 mg/dL 8.1(X5.7(L) 6 7  Creatinine 0.6 - 1.1 mg/dL 0.7 1.61 0.96  Sodium 045 - 145 mEq/L 139 139 144  Potassium 3.5 - 5.1 mEq/L 3.6 3.7 3.7  Chloride 96 - 112 mEq/L - 103 103  CO2 22 - 29 mEq/L 25 25 -  Calcium 8.4 - 10.4 mg/dL 9.5 9.5 -  Total Protein 6.4 - 8.3 g/dL 8.2 7.2 -  Total Bilirubin 0.20 - 1.20 mg/dL 4.09 0.2 -  Alkaline Phos 40 - 150 U/L 78 80 -  AST 5 - 34 U/L 14 12 -  ALT 0 - 55 U/L 11 10 -   . Lab Results  Component Value Date   IRON 72 09/17/2016   TIBC 318 09/17/2016   IRONPCTSAT 23 09/17/2016   (Iron and TIBC)  Lab Results  Component Value Date   FERRITIN 27 09/17/2016   . Lab Results  Component Value Date   LDH 148 07/19/2016       RADIOGRAPHIC STUDIES: I have personally reviewed the radiological images as listed and agreed with the findings in the report. No results found.  ASSESSMENT & PLAN:   31 year old African-American female with  1) significant RBC Microcytosis with mild Anemia - due to Alpha thal trait + Mild iron deficiency. She'll noted to have some mild iron deficiency with a ferritin level of 27 and iron saturation of 23%. Patient's microcytosis was out of proportion to her degree of anemia and also her RBC count is elevated in proportion  to Hgb levels suggesting relative polycythemia. Patient had alpha thalassemia gene deletion studies which are consistent with thalassemia trait. (would like always remain microcytic) PLAN -Patient is clinically feeling much better and is tolerating the oral iron polysaccharide without any issues. She notes that her menstrual losses have improved and her last period was lighter. -continue  iron polysaccharide 150 mg by mouth twice daily with orange juice until ferritin closer to 100 then may switch to Iron polysaccharide 150mg  po daily if periods heavier or just Multivitamin with Iron if periods regular. -recheck CBC, ferritin and Iron profile with PCP in 3 months -Take daily vitamin B complex   Further management per PCP PLz reconsult [place] if the patient has inadequate response to PO iron despite compliance or if there is worsening anemia (which might lead to consideration of IV Iron)   All of the patients questions were answered with apparent satisfaction. The patient knows to call the clinic with any problems, questions or concerns.  I spent 20 minutes counseling the patient face to face. The total time spent in the appointment was 20 minutes and more than 50% was on counseling and direct patient cares.    Wyvonnia Lora MD MS AAHIVMS [facility] Hematology/Oncology Physician [facility]  (Office):       [phone] (Work cell):  [phone] (Fax):           [phone]  09/17/2016 3:33 PM

## 2016-09-18 ENCOUNTER — Encounter: Payer: Self-pay | Admitting: Hematology

## 2016-09-18 LAB — IRON AND TIBC
%SAT: 23 % (ref 21–57)
IRON: 72 ug/dL (ref 41–142)
TIBC: 318 ug/dL (ref 236–444)
UIBC: 246 ug/dL (ref 120–384)

## 2016-09-18 LAB — FERRITIN: FERRITIN: 27 ng/mL (ref 9–269)

## 2016-09-23 ENCOUNTER — Encounter: Payer: Self-pay | Admitting: *Deleted

## 2016-09-23 NOTE — Progress Notes (Signed)
Notes Recorded by Johney MaineGautam Kishore Kale, MD on 09/18/2016 at 11:50 AM EST Plz let patient know results and that we would recommend continue Iron polysaccharide 150mg  po BID for 3 months and recheck Iron labs and cbc with PCP in 3 months and adjust PO iron replacement as per PCP at that time based on levels. Thanks GK      RN called patient and left voicemail.

## 2016-12-19 ENCOUNTER — Ambulatory Visit: Payer: BC Managed Care – PPO | Admitting: Obstetrics and Gynecology

## 2017-01-24 ENCOUNTER — Ambulatory Visit (INDEPENDENT_AMBULATORY_CARE_PROVIDER_SITE_OTHER): Payer: BC Managed Care – PPO | Admitting: Obstetrics and Gynecology

## 2017-01-24 ENCOUNTER — Other Ambulatory Visit: Payer: Self-pay | Admitting: Obstetrics and Gynecology

## 2017-01-24 ENCOUNTER — Encounter: Payer: Self-pay | Admitting: Obstetrics and Gynecology

## 2017-01-24 VITALS — BP 118/70 | HR 68 | Resp 16 | Ht 66.0 in | Wt 151.0 lb

## 2017-01-24 DIAGNOSIS — Z113 Encounter for screening for infections with a predominantly sexual mode of transmission: Secondary | ICD-10-CM

## 2017-01-24 DIAGNOSIS — Z01419 Encounter for gynecological examination (general) (routine) without abnormal findings: Secondary | ICD-10-CM

## 2017-01-24 DIAGNOSIS — N898 Other specified noninflammatory disorders of vagina: Secondary | ICD-10-CM | POA: Diagnosis not present

## 2017-01-24 NOTE — Progress Notes (Signed)
31 y.o. G1P1001 Single African American female here for annual exam.    Having abnormal discharge.  Mild itching.   No new partners.  Same partner for 3 years.  Wants STD testing.   Satisfied with condom use.  Rarely uses Valtrex.   Lost 50 pounds. Now on Synthroid. Feels tired all the time.  Diagnosis of thalassemia trait. Seeing Heme ONC.   Has alopecia and now uses a wig.  [facility]   PCP:  Laverda SorensonAmie Adams, NP   Patient's last menstrual period was 01/10/2017.           Sexually active: Yes.    The current method of family planning is condoms most of the time.    Exercising: No.  The patient does not participate in regular exercise at present. Smoker:  no  Health Maintenance: Pap:  12-01-14 Neg  11-10-13 normal History of abnormal Pap:  Yes, 2009/2010 hx LGSIL. Had colposcopy which revealed CIN II. No tx. MMG:  No Colonoscopy:  No BMD:   No  Result  No TDaP:  Due Gardasil:   no HIV: Completed Hep C: Completed Screening Labs: PCP   reports that she has never smoked. She has never used smokeless tobacco. She reports that she does not drink alcohol or use drugs.  Past Medical History:  Diagnosis Date  . 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 Alopecia totalis   . Anxiety   . Depression   . Dysmenorrhea   . 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 Thyroid disease    dx'd with Hashimoto's--sees Dr. Sharl MaKerr.  Status post radioactive iodine    Past Surgical History:  Procedure Laterality Date  . CESAREAN SECTION  2007  . HERNIA REPAIR  2009    Current Outpatient Prescriptions  Medication Sig Dispense Refill  . Cholecalciferol (VITAMIN D3) 5000 units CAPS Take 1 capsule by mouth daily.    Marland Kitchen. escitalopram (LEXAPRO) 10 MG tablet Take 10 mg by mouth.    . Iron-FA-B Cmp-C-Biot-Probiotic  (FUSION PLUS) CAPS     . levothyroxine (SYNTHROID, LEVOTHROID) 100 MCG tablet Take 88 mcg by mouth daily before breakfast.     . Multiple Vitamin (MULTI-VITAMINS) TABS (Plexus XFactor Plus) Take 2 capsules once a day    . valACYclovir (VALTREX) 500 MG tablet Take 1 tablet (500 mg total) by mouth 2 (two) times daily. Take for 3 days as needed. (Patient taking differently: Take 500 mg by mouth as needed. Take for 3 days as needed.) 30 tablet 1   No current facility-administered medications for this visit.     Family History  Problem Relation Age of Onset  . Anxiety disorder Mother   . Thyroid disease Mother   . Hypertension Mother   . Thyroid disease Father   . Cancer Paternal Grandfather 780       dec--colon a  . Seizures Maternal Aunt        epilepsy  . Diabetes Maternal Grandmother     ROS:  Pertinent items are noted in HPI.  Otherwise, a comprehensive ROS was negative.  Exam:   BP 118/70 (BP Location: Right Arm, Patient Position: Sitting, Cuff Size: Normal)   Pulse 68   Resp 16   Ht 5\' 6"  (1.676 m)   Wt 151 lb (68.5 kg)  LMP 01/10/2017   BMI 24.37 kg/m     General appearance: alert, cooperative and appears stated age Head: Normocephalic, without obvious abnormality, atraumatic Neck: no adenopathy, supple, symmetrical, trachea midline and thyroid normal to inspection and palpation Lungs: clear to auscultation bilaterally Breasts: normal appearance, no masses or tenderness, No nipple retraction or dimpling, No nipple discharge or bleeding, No axillary or supraclavicular adenopathy Heart: regular rate and rhythm Abdomen: soft, non-tender; no masses, no organomegaly Extremities: extremities normal, atraumatic, no cyanosis or edema Skin: Skin color, texture, turgor normal. No rashes or lesions Lymph nodes: Cervical, supraclavicular, and axillary nodes normal. No abnormal inguinal nodes palpated Neurologic: Grossly normal  Pelvic: External genitalia:  no lesions               Urethra:  normal appearing urethra with no masses, tenderness or lesions              Bartholins and Skenes: normal                 Vagina: normal appearing vagina with normal color and discharge, no lesions              Cervix: no lesions              Pap taken: Yes.   Bimanual Exam:  Uterus:  normal size, contour, position, consistency, mobility, non-tender              Adnexa: no mass, fullness, tenderness                Chaperone was present for exam.  Assessment:   Well woman visit with normal exam. Hx prior LGSIL.  Hx HSV II.  Alopecia.  Hashimoto's thyroiditis.   Desire for STD screening.  Vaginal discharge. Intentional weight loss.  Plan: Mammogram screening discussed. Recommended self breast awareness. Pap and HR HPV as above. Guidelines for Calcium, Vitamin D, regular exercise program including cardiovascular and weight bearing exercise. STD screening and Affirm testing.  Follow up annually and prn.     After visit summary provided.

## 2017-01-24 NOTE — Patient Instructions (Signed)

## 2017-01-25 LAB — WET PREP BY MOLECULAR PROBE
Candida species: DETECTED — AB
Gardnerella vaginalis: DETECTED — AB
TRICHOMONAS VAG: NOT DETECTED

## 2017-01-25 LAB — STD PANEL
HIV 1&2 Ab, 4th Generation: NONREACTIVE
Hepatitis B Surface Ag: NEGATIVE

## 2017-01-25 LAB — HEPATITIS C ANTIBODY: HCV Ab: NEGATIVE

## 2017-01-27 LAB — IPS PAP TEST WITH HPV

## 2017-01-28 ENCOUNTER — Other Ambulatory Visit: Payer: Self-pay | Admitting: *Deleted

## 2017-01-28 ENCOUNTER — Telehealth: Payer: Self-pay | Admitting: Obstetrics and Gynecology

## 2017-01-28 LAB — IPS N GONORRHOEA AND CHLAMYDIA BY PCR

## 2017-01-28 LAB — IPS HPV ON A LIQUID BASED SPECIMEN

## 2017-01-28 MED ORDER — FLUCONAZOLE 150 MG PO TABS: ORAL_TABLET | ORAL | 0 refills | Status: DC

## 2017-01-28 MED ORDER — METRONIDAZOLE 500 MG PO TABS: 500.0000 mg | ORAL_TABLET | Freq: Two times a day (BID) | ORAL | 0 refills | Status: DC

## 2017-01-28 MED ORDER — METRONIDAZOLE 0.75 % VA GEL: VAGINAL | 0 refills | Status: DC

## 2017-01-28 NOTE — Telephone Encounter (Signed)
Patient would like to speak with the nurse about changing her prescription from the cream to the pill that was called in today because of price. [facility] [address] [phone].

## 2017-01-28 NOTE — Telephone Encounter (Signed)
[facility]atalie at [facility], discontinued metrogel.  Left detailed message, ok per current dpr. Advised RX for metrogel discontinued, new RX for oral Flagyl sent, take bid for 7 days. Return call to office for any additional questions.   Routing to provider for final review. Patient is agreeable to disposition. Will close encounter.

## 2017-12-30 ENCOUNTER — Other Ambulatory Visit: Payer: Self-pay | Admitting: Chiropractic Medicine

## 2017-12-30 DIAGNOSIS — M542 Cervicalgia: Secondary | ICD-10-CM

## 2018-01-03 ENCOUNTER — Ambulatory Visit
Admission: RE | Admit: 2018-01-03 | Discharge: 2018-01-03 | Disposition: A | Discharge status: Home / Self Care | Payer: BC Managed Care – PPO | Source: Ambulatory Visit | Attending: Chiropractic Medicine | Admitting: Chiropractic Medicine

## 2018-01-03 DIAGNOSIS — M542 Cervicalgia: Secondary | ICD-10-CM

## 2018-01-29 ENCOUNTER — Ambulatory Visit: Payer: BC Managed Care – PPO | Admitting: Obstetrics and Gynecology

## 2018-02-18 ENCOUNTER — Telehealth: Payer: Self-pay | Admitting: Obstetrics and Gynecology

## 2018-02-18 NOTE — Telephone Encounter (Signed)
Left patient a message to call back when ready to reschedule, canceled by automated reminder call. °

## 2018-02-20 ENCOUNTER — Ambulatory Visit: Payer: BC Managed Care – PPO | Admitting: Obstetrics and Gynecology

## 2018-06-24 ENCOUNTER — Other Ambulatory Visit: Payer: Self-pay | Admitting: Chiropractic Medicine

## 2018-06-24 DIAGNOSIS — M545 Low back pain, unspecified: Secondary | ICD-10-CM

## 2018-09-04 ENCOUNTER — Ambulatory Visit: Payer: BC Managed Care – PPO | Admitting: Obstetrics and Gynecology
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
## 2018-09-16 DIAGNOSIS — R87810 Cervical high risk human papillomavirus (HPV) DNA test positive: Secondary | ICD-10-CM

## 2018-09-16 HISTORY — DX: Cervical high risk human papillomavirus (HPV) DNA test positive: R87.810

## 2018-09-18 ENCOUNTER — Other Ambulatory Visit (HOSPITAL_COMMUNITY)
Admission: RE | Admit: 2018-09-18 | Discharge: 2018-09-18 | Disposition: A | Discharge status: Home / Self Care | Payer: BC Managed Care – PPO | Source: Ambulatory Visit | Attending: Obstetrics and Gynecology | Admitting: Obstetrics and Gynecology

## 2018-09-18 ENCOUNTER — Other Ambulatory Visit: Payer: Self-pay

## 2018-09-18 ENCOUNTER — Ambulatory Visit (INDEPENDENT_AMBULATORY_CARE_PROVIDER_SITE_OTHER): Payer: BC Managed Care – PPO | Admitting: Obstetrics and Gynecology

## 2018-09-18 ENCOUNTER — Encounter: Payer: Self-pay | Admitting: Obstetrics and Gynecology

## 2018-09-18 VITALS — BP 116/72 | HR 88 | Resp 16 | Ht 65.0 in | Wt 164.0 lb

## 2018-09-18 DIAGNOSIS — Z01419 Encounter for gynecological examination (general) (routine) without abnormal findings: Secondary | ICD-10-CM

## 2018-09-18 NOTE — Patient Instructions (Signed)
EXERCISE AND DIET:  We recommended that you start or continue a regular exercise program for good health. Regular exercise means any activity that makes your heart beat faster and makes you sweat.  We recommend exercising at least 30 minutes per day at least 3 days a week, preferably 4 or 5.  We also recommend a diet low in fat and sugar.  Inactivity, poor dietary choices and obesity can cause diabetes, heart attack, stroke, and kidney damage, among others.    ALCOHOL AND SMOKING:  Women should limit their alcohol intake to no more than 7 drinks/beers/glasses of wine (combined, not each!) per week. Moderation of alcohol intake to this level decreases your risk of breast cancer and liver damage. And of course, no recreational drugs are part of a healthy lifestyle.  And absolutely no smoking or even second hand smoke. Most people know smoking can cause heart and lung diseases, but did you know it also contributes to weakening of your bones? Aging of your skin?  Yellowing of your teeth and nails?  CALCIUM AND VITAMIN D:  Adequate intake of calcium and Vitamin D are recommended.  The recommendations for exact amounts of these supplements seem to change often, but generally speaking 600 mg of calcium (either carbonate or citrate) and 800 units of Vitamin D per day seems prudent. Certain women may benefit from higher intake of Vitamin D.  If you are among these women, your doctor will have told you during your visit.    PAP SMEARS:  Pap smears, to check for cervical cancer or precancers,  have traditionally been done yearly, although recent scientific advances have shown that most women can have pap smears less often.  However, every woman still should have a physical exam from her gynecologist every year. It will include a breast check, inspection of the vulva and vagina to check for abnormal growths or skin changes, a visual exam of the cervix, and then an exam to evaluate the size and shape of the uterus and  ovaries.  And after 33 years of age, a rectal exam is indicated to check for rectal cancers. We will also provide age appropriate advice regarding health maintenance, like when you should have certain vaccines, screening for sexually transmitted diseases, bone density testing, colonoscopy, mammograms, etc.   MAMMOGRAMS:  All women over 33 years old should have a yearly mammogram. Many facilities now offer a "3D" mammogram, which may cost around $50 extra out of pocket. If possible,  we recommend you accept the option to have the 3D mammogram performed.  It both reduces the number of women who will be called back for extra views which then turn out to be normal, and it is better than the routine mammogram at detecting truly abnormal areas.    COLONOSCOPY:  Colonoscopy to screen for colon cancer is recommended for all women at age 33.  We know, you hate the idea of the prep.  We agree, BUT, having colon cancer and not knowing it is worse!!  Colon cancer so often starts as a polyp that can be seen and removed at colonscopy, which can quite literally save your life!  And if your first colonoscopy is normal and you have no family history of colon cancer, most women don't have to have it again for 10 years.  Once every ten years, you can do something that may end up saving your life, right?  We will be happy to help you get it scheduled when you are ready.    Be sure to check your insurance coverage so you understand how much it will cost.  It may be covered as a preventative service at no cost, but you should check your particular policy.      Human Papillomavirus Human papillomavirus (HPV) is the most common sexually transmitted infection (STI). It easily spreads from person to person (is highly contagious). HPV infections cause genital warts. Certain types of HPV may cause cancers, including cancer of the lower part of the uterus (cervix), vagina, outer female genital area (vulva), penis, anus, and rectum. HPV  may also cause cancers of the oral cavity, such as the throat, tongue, and tonsils. There are many types of HPV. It usually does not cause symptoms. However, sometimes there are wart-like lesions in the throat or warts in the genital area that you can see or feel. It is possible to be infected for long periods and pass HPV to others without knowing it. What are the causes? HPV is caused by a virus that spreads from person to person through sexual contact. This includes oral, vaginal, or anal sex. What increases the risk? The following factors may make you more likely to develop this condition:  Having unprotected oral, vaginal, or anal sex.  Having several sex partners.  Having a sex partner who has other sex partners.  Having or having had another STI.  Having a weak disease-fighting (immune) system.  Having damaged skin in the genital area. What are the signs or symptoms? Most people who have HPV do not have any symptoms. If symptoms are present, they may include:  Wartlike lesions in the throat (from having oral sex).  Warts on the infected skin or mucous membranes.  Genital warts that may itch, burn, bleed, or be painful during sexual intercourse. How is this diagnosed? If wartlike lesions are present in the throat or if genital warts are present, your health care provider can usually diagnose HPV with a physical exam. Genital warts are easily seen. In females, tests may be used to diagnose HPV, including:  A Pap test. A Pap test takes a sample of cells from your cervix to check for cancer and HPV infection.  An HPV test. This is similar to a Pap test and involves taking a sample of cells from your cervix.  Using a scope to view the cervix (colposcopy). This may be done if a pelvic exam or Pap test is abnormal. A sample of tissue may be removed for testing (biopsy) during the colposcopy. Currently, there is no test to detect HPV in males. How is this treated? There is no  treatment for the virus itself. However, there are treatments for the health problems and symptoms HPV can cause. Your health care provider will monitor you closely after you are treated as HPV can come back and may need treatment again. Treatment for HPV may include:  Medicines, which may be injected or applied to genital warts in a cream, lotion, liquid or gel form.  Use of a probe to apply extreme cold (cryotherapy) to the genital warts.  Application of an intense beam of light (laser treatment) on the genital warts.  Use of a probe to apply extreme heat (electrocautery) on the genital warts.  Surgery to remove the genital warts. Follow these instructions at home: Medicines  Take over-the-counter and prescription medicines only as told by your health care provider. This include creams for itching or irritation.  Do not treat genital warts with medicines used for treating hand warts. General instructions  Do not touch or scratch the warts.  Do not have sex while you are being treated.  Do not douche or use tampons during treatment (women).  Tell your sex partner about your infection. He or she may also need to be treated.  If you become pregnant, tell your health care provider that you have HPV. Your health care provider will monitor you closely during pregnancy to make sure your baby is safe.  Keep all follow-up visits as told by your health care provider. This is important. How is this prevented?  Talk with your health care provider about getting the HPV vaccines. These vaccines prevent some HPV infections and cancers. The vaccines are recommended for males and females between the ages of 569 and 4726. They will not work if you already have HPV, and they are not recommended for pregnant women.  After treatment, use condoms during sex to prevent future infections.  Have only one sex partner.  Have a sex partner who does not have other sex partners.  Get regular Pap tests as  directed by your health care provider. Contact a health care provider if:  The treated skin becomes red, swollen, or painful.  You have a fever.  You feel generally ill.  You feel lumps or pimples sticking out in and around your genital area.  You develop bleeding of the vagina or the treatment area.  You have painful sexual intercourse. Summary  Human papillomavirus (HPV) is the most common sexually transmitted infection (STI) and is highly contagious.  Most people carrying HPV do not have any symptoms.  HPV can be prevented with vaccination. The vaccine is recommended for males and females between the ages of 399 and 4726.  There is no treatment for the virus itself. However, there are treatments for the health problems and symptoms HPV can cause. This information is not intended to replace advice given to you by your health care provider. Make sure you discuss any questions you have with your health care provider. Document Released: 11/23/2003 Document Revised: 08/11/2016 Document Reviewed: 08/11/2016 Elsevier Interactive Patient Education  2019 [employer]

## 2018-09-18 NOTE — Progress Notes (Signed)
33 y.o. G1P1001 Single African American female here for annual exam.    Son is now 33 years old.   Labs with PCP.  PCP: Dr. Georgena SpurlingGassemi    Patient's last menstrual period was 09/07/2018.           Sexually active: Yes.    The current method of family planning is condoms sometimes.    Exercising: No.  The patient does not participate in regular exercise at present. 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 Had colposcopy which revealed CIN II. No tx. TDaP:  2006 Gardasil:   no HIV and Hep C: 01/24/17 Negative Screening Labs:  Discuss today   reports that she has never smoked. She has never used smokeless tobacco. She reports that she does not drink alcohol or use drugs.  Past Medical History:  Diagnosis Date  . 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 Alopecia totalis   . Alpha thalassemia trait   . Anxiety   . Depression   . Dysmenorrhea   . 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 Thyroid disease    dx'd with Hashimoto's--sees Dr. Sharl MaKerr.  Status post radioactive iodine    Past Surgical History:  Procedure Laterality Date  . CESAREAN SECTION  2007  . HERNIA REPAIR  2009    Current Outpatient Medications  Medication Sig Dispense Refill  . Cholecalciferol (VITAMIN D3) 5000 units CAPS Take 1 capsule by mouth daily.    Marland Kitchen. escitalopram (LEXAPRO) 10 MG tablet Take 10 mg by mouth.    . Iron-FA-B Cmp-C-Biot-Probiotic (FUSION PLUS) CAPS     . levothyroxine (SYNTHROID, LEVOTHROID) 100 MCG tablet Take 100 mcg by mouth daily before breakfast.     . Multiple Vitamin (MULTI-VITAMINS) TABS (Plexus XFactor Plus) Take 2 capsules once a day    . valACYclovir (VALTREX) 500 MG tablet Take 1 tablet (500 mg total) by mouth 2 (two) times daily. Take for 3  days as needed. (Patient taking differently: Take 500 mg by mouth as needed. Take for 3 days as needed.) 30 tablet 1   No current facility-administered medications for this visit.     Family History  Problem Relation Age of Onset  . Anxiety disorder Mother   . Thyroid disease Mother   . Hypertension Mother   . Thyroid disease Father   . Cancer Paternal Grandfather [id number]       dec--colon a  . Seizures Maternal Aunt        epilepsy  . Diabetes Maternal Grandmother     Review of Systems  Constitutional: Negative.   HENT: Negative.   Eyes: Negative.   Respiratory: Negative.   Cardiovascular: Negative.   Gastrointestinal: Negative.   Endocrine: Negative.   Genitourinary: Negative.   Musculoskeletal: Negative.   Skin: Negative.   Allergic/Immunologic: Negative.   Neurological: Negative.   Hematological: Negative.   Psychiatric/Behavioral: Negative.     Exam:   BP 116/72 (BP Location: Right Arm, Patient Position: Sitting, Cuff Size: Normal)   Pulse 88   Resp 16   Ht 5\' 5"  (1.651 m)   Wt 164 lb (74.4 kg)   LMP 09/07/2018   BMI 27.29 kg/m     General appearance: alert, cooperative and appears stated age  Head: Normocephalic, without obvious abnormality, atraumatic Neck: no adenopathy, supple, symmetrical, trachea midline and thyroid normal to inspection and palpation Lungs: clear to auscultation bilaterally Breasts: normal appearance, no masses or tenderness, No nipple retraction or dimpling, No nipple discharge or bleeding, No axillary or supraclavicular adenopathy Heart: regular rate and rhythm Abdomen: soft, non-tender; no masses, no organomegaly Extremities: extremities normal, atraumatic, no cyanosis or edema Skin: Skin color, texture, turgor normal. No rashes or lesions Lymph nodes: Cervical, supraclavicular, and axillary nodes normal. No abnormal inguinal nodes palpated Neurologic: Grossly normal  Pelvic: External genitalia:  no lesions              Urethra:   normal appearing urethra with no masses, tenderness or lesions              Bartholins and Skenes: normal                 Vagina: normal appearing vagina with normal color and discharge, no lesions              Cervix: no lesions              Pap taken: Yes.   Bimanual Exam:  Uterus:  normal size, contour, position, consistency, mobility, non-tender              Adnexa: no mass, fullness, tenderness             Chaperone was present for exam.  Assessment:   Well woman visit with normal exam. Hx prior CIN II. Hx HSV II.  Alopecia.  Hashimoto's thyroiditis.   Plan: Mammogram screening. Recommended self breast awareness. Pap and HR HPV as above. Guidelines for Calcium, Vitamin D, regular exercise program including cardiovascular and weight bearing exercise. Does not need refill of Valtrex  Labs with PCP.  I discussed Gardasil, TDap, and flu vaccines.  She will consider.  Follow up annually and prn.   After visit summary provided.

## 2018-09-22 LAB — CYTOLOGY - PAP
Diagnosis: NEGATIVE
HPV 16/18/45 GENOTYPING: NEGATIVE
HPV: DETECTED — AB

## 2018-09-27 ENCOUNTER — Encounter: Payer: Self-pay | Admitting: Obstetrics and Gynecology

## 2018-09-28 ENCOUNTER — Telehealth: Payer: Self-pay

## 2018-09-28 NOTE — Telephone Encounter (Signed)
Spoke with patient. Advised of results as seen below from Dr.Silva. Patient verbalizes understanding. HR HPV 16/18/45 added through [facility]. Will hold result note. Encounter closed.

## 2018-09-28 NOTE — Telephone Encounter (Signed)
-----   Message from Patton Salles, MD sent at 09/22/2018  3:13 PM EST ----- Please contact patient with preliminary results to her pap. The cells were normal, but she tested positive for high risk HPV.  Please add HR HPV subtyping 16/18/45 to her pap.   If she is negative for 16/18/45, no further evaluation is needed at this time.  If she tests positive for either 16/18/45, then she will need a colposcopy with me.  At a minimum, she needs an 08 recall for pap and HR HPV in one year.

## 2018-09-29 ENCOUNTER — Telehealth: Payer: Self-pay

## 2018-09-29 NOTE — Telephone Encounter (Addendum)
Spoke with patient. Results given. Patient verbalizes understanding. 08 recall entered. Encounter closed.  Notes recorded by Patton Salles, MD on 09/27/2018 at 11:44 AM EST Please let patient know of her pap results which is normal but with positive HR.  Her additional testing showed negative 16/18/45 subtypes of high risk HPV, so colposcopy and treatment are not needed.  She needs recall 08 please.

## 2019-06-24 ENCOUNTER — Ambulatory Visit: Payer: BC Managed Care – PPO | Admitting: Obstetrics and Gynecology

## 2019-06-24 ENCOUNTER — Other Ambulatory Visit: Payer: Self-pay

## 2019-06-24 ENCOUNTER — Encounter: Payer: Self-pay | Admitting: Obstetrics and Gynecology

## 2019-06-24 VITALS — BP 118/70 | HR 78 | Temp 97.5°F | Ht 65.0 in | Wt 165.6 lb

## 2019-06-24 DIAGNOSIS — N898 Other specified noninflammatory disorders of vagina: Secondary | ICD-10-CM

## 2019-06-24 DIAGNOSIS — Z113 Encounter for screening for infections with a predominantly sexual mode of transmission: Secondary | ICD-10-CM

## 2019-06-24 NOTE — Progress Notes (Signed)
GYNECOLOGY  VISIT   HPI: 33 y.o.   Single  African American  female   G1P1001 with Patient's last menstrual period was 06/02/2019 (approximate).   here for STD testing. Patient would like to have blood work also.  Having a little bit of discharge. No itching or odor.   No pain.   No abnormal bleeding.   GYNECOLOGIC HISTORY: Patient's last menstrual period was 06/02/2019 (approximate). Contraception: None Menopausal hormone therapy:  n/a Last mammogram:  n/a Last pap smear: 10-15-18 Neg:Pos HR HPV;Neg 16/18/45,01-24-17 Neg:Neg HR HPV, 12-01-14 Neg        OB History    Gravida  1   Para  1   Term  1   Preterm      AB      Living  1     SAB      TAB      Ectopic      Multiple      Live Births                 Patient Active Problem List   Diagnosis Date Noted  . Alopecia (capitis) totalis 11/26/2015    Past Medical History:  Diagnosis Date  . 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 Alopecia totalis   . Alpha thalassemia trait   . Anxiety   . 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  Marland Kitchen Depression   . Dysmenorrhea   . 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 Thyroid disease    dx'd with Hashimoto's--sees Dr. Sharl Ma.  Status post radioactive iodine    Past Surgical History:  Procedure Laterality Date  . CESAREAN SECTION  2007  . HERNIA REPAIR  2009    Current Outpatient Medications  Medication Sig Dispense Refill  . ALPRAZolam (XANAX) 0.25 MG tablet Take 1 tablet by mouth as needed.    . Cholecalciferol (VITAMIN D3) 5000 units CAPS Take 1 capsule by mouth daily.    Marland Kitchen escitalopram (LEXAPRO) 10 MG tablet Take 10 mg by mouth.    . Iron-FA-B Cmp-C-Biot-Probiotic (FUSION PLUS) CAPS     . levothyroxine (SYNTHROID, LEVOTHROID) 100 MCG tablet  Take 100 mcg by mouth daily before breakfast.     . Multiple Vitamin (MULTI-VITAMINS) TABS (Plexus XFactor Plus) Take 2 capsules once a day    . valACYclovir (VALTREX) 500 MG tablet Take 1 tablet (500 mg total) by mouth 2 (two) times daily. Take for 3 days as needed. (Patient taking differently: Take 500 mg by mouth as needed. Take for 3 days as needed.) 30 tablet 1   No current facility-administered medications for this visit.      ALLERGIES: Patient has no known allergies.  Family History  Problem Relation Age of Onset  . Anxiety disorder Mother   . Thyroid disease Mother   . Hypertension Mother   . Thyroid disease Father   . Cancer Paternal Grandfather 9       dec--colon a  . Seizures Maternal Aunt        epilepsy  . Diabetes Maternal Grandmother     Social History   Socioeconomic History  . Marital status: Single    Spouse name: Not on file  . Number of children: Not on file  . Years of  education: Not on file  . Highest education level: Not on file  Occupational History  . Not on file  Social Needs  . Financial resource strain: Not on file  . Food insecurity    Worry: Not on file    Inability: Not on file  . Transportation needs    Medical: Not on file    Non-medical: Not on file  Tobacco Use  . Smoking status: Never Smoker  . Smokeless tobacco: Never Used  Substance and Sexual Activity  . Alcohol use: No    Alcohol/week: 0.0 standard drinks  . Drug use: No  . Sexual activity: Yes    Partners: Male    Birth control/protection: Condom  Lifestyle  . Physical activity    Days per week: Not on file    Minutes per session: Not on file  . Stress: Not on file  Relationships  . Social Herbalist on phone: Not on file    Gets together: Not on file    Attends religious service: Not on file    Active member of club or organization: Not on file    Attends meetings of clubs or organizations: Not on file    Relationship status: Not on file  . Intimate  partner violence    Fear of current or ex partner: Not on file    Emotionally abused: Not on file    Physically abused: Not on file    Forced sexual activity: Not on file  Other Topics Concern  . Not on file  Social History Narrative  . Not on file    Review of Systems  All other systems reviewed and are negative.   PHYSICAL EXAMINATION:    BP 118/70   Pulse 78   Temp (!) 97.5 F (36.4 C) (Temporal)   Ht 5\' 5"  (1.651 m)   Wt 165 lb 9.6 oz (75.1 kg)   LMP 06/02/2019 (Approximate)   BMI 27.56 kg/m     General appearance: alert, cooperative and appears stated age   Pelvic: External genitalia:  no lesions              Urethra:  normal appearing urethra with no masses, tenderness or lesions              Bartholins and Skenes: normal                 Vagina: normal appearing vagina with normal color and yellow discharge, no lesions              Cervix: no lesions                Bimanual Exam:  Uterus:  normal size, contour, position, consistency, mobility, non-tender              Adnexa: no mass, fullness, tenderness            Chaperone was present for exam.  ASSESSMENT  Vaginal discharge. STD screening.  Hx HSV II.   PLAN  NuSwab for vaginitis and STD testing.  HIV, syphilis, Hep B and Hep C testing.  We discussed asymptomatic shedding of herpes virus.   An After Visit Summary was printed and given to the patient.  __15____ minutes face to face time of which over 50% was spent in counseling.

## 2019-06-25 LAB — HEP, RPR, HIV PANEL
HIV Screen 4th Generation wRfx: NONREACTIVE
Hepatitis B Surface Ag: NEGATIVE
RPR Ser Ql: NONREACTIVE

## 2019-06-25 LAB — HEPATITIS C ANTIBODY: Hep C Virus Ab: <0.1 s/co ratio (ref 0.0–0.9)

## 2019-06-27 LAB — NUSWAB VAGINITIS PLUS (VG+)
Candida albicans, NAA: NEGATIVE
Candida glabrata, NAA: NEGATIVE
Chlamydia trachomatis, NAA: NEGATIVE
Neisseria gonorrhoeae, NAA: NEGATIVE
Trich vag by NAA: NEGATIVE

## 2019-09-22 ENCOUNTER — Ambulatory Visit: Payer: BC Managed Care – PPO | Admitting: Obstetrics and Gynecology

## 2019-10-12 ENCOUNTER — Ambulatory Visit: Payer: BC Managed Care – PPO | Admitting: Obstetrics and Gynecology

## 2019-11-08 ENCOUNTER — Other Ambulatory Visit: Payer: Self-pay

## 2019-11-09 ENCOUNTER — Ambulatory Visit (INDEPENDENT_AMBULATORY_CARE_PROVIDER_SITE_OTHER): Payer: BC Managed Care – PPO | Admitting: Obstetrics and Gynecology

## 2019-11-09 ENCOUNTER — Encounter: Payer: Self-pay | Admitting: Obstetrics and Gynecology

## 2019-11-09 ENCOUNTER — Other Ambulatory Visit (HOSPITAL_COMMUNITY)
Admission: RE | Admit: 2019-11-09 | Discharge: 2019-11-09 | Disposition: A | Discharge status: Home / Self Care | Payer: BC Managed Care – PPO | Source: Ambulatory Visit | Attending: Obstetrics and Gynecology | Admitting: Obstetrics and Gynecology

## 2019-11-09 VITALS — BP 110/60 | HR 70 | Temp 97.1°F | Resp 14 | Ht 66.0 in | Wt 171.5 lb

## 2019-11-09 DIAGNOSIS — Z01419 Encounter for gynecological examination (general) (routine) without abnormal findings: Secondary | ICD-10-CM

## 2019-11-09 MED ORDER — VALACYCLOVIR HCL 500 MG PO TABS: 500.0000 mg | ORAL_TABLET | Freq: Two times a day (BID) | ORAL | 1 refills | Status: DC

## 2019-11-09 NOTE — Patient Instructions (Signed)
EXERCISE AND DIET:  We recommended that you start or continue a regular exercise program for good health. Regular exercise means any activity that makes your heart beat faster and makes you sweat.  We recommend exercising at least 30 minutes per day at least 3 days a week, preferably 4 or 5.  We also recommend a diet low in fat and sugar.  Inactivity, poor dietary choices and obesity can cause diabetes, heart attack, stroke, and kidney damage, among others.    ALCOHOL AND SMOKING:  Women should limit their alcohol intake to no more than 7 drinks/beers/glasses of wine (combined, not each!) per week. Moderation of alcohol intake to this level decreases your risk of breast cancer and liver damage. And of course, no recreational drugs are part of a healthy lifestyle.  And absolutely no smoking or even second hand smoke. Most people know smoking can cause heart and lung diseases, but did you know it also contributes to weakening of your bones? Aging of your skin?  Yellowing of your teeth and nails?  CALCIUM AND VITAMIN D:  Adequate intake of calcium and Vitamin D are recommended.  The recommendations for exact amounts of these supplements seem to change often, but generally speaking 600 mg of calcium (either carbonate or citrate) and 800 units of Vitamin D per day seems prudent. Certain women may benefit from higher intake of Vitamin D.  If you are among these women, your doctor will have told you during your visit.    PAP SMEARS:  Pap smears, to check for cervical cancer or precancers,  have traditionally been done yearly, although recent scientific advances have shown that most women can have pap smears less often.  However, every woman still should have a physical exam from her gynecologist every year. It will include a breast check, inspection of the vulva and vagina to check for abnormal growths or skin changes, a visual exam of the cervix, and then an exam to evaluate the size and shape of the uterus and  ovaries.  And after 34 years of age, a rectal exam is indicated to check for rectal cancers. We will also provide age appropriate advice regarding health maintenance, like when you should have certain vaccines, screening for sexually transmitted diseases, bone density testing, colonoscopy, mammograms, etc.   MAMMOGRAMS:  All women over 40 years old should have a yearly mammogram. Many facilities now offer a "3D" mammogram, which may cost around $50 extra out of pocket. If possible,  we recommend you accept the option to have the 3D mammogram performed.  It both reduces the number of women who will be called back for extra views which then turn out to be normal, and it is better than the routine mammogram at detecting truly abnormal areas.    COLONOSCOPY:  Colonoscopy to screen for colon cancer is recommended for all women at age 50.  We know, you hate the idea of the prep.  We agree, BUT, having colon cancer and not knowing it is worse!!  Colon cancer so often starts as a polyp that can be seen and removed at colonscopy, which can quite literally save your life!  And if your first colonoscopy is normal and you have no family history of colon cancer, most women don't have to have it again for 10 years.  Once every ten years, you can do something that may end up saving your life, right?  We will be happy to help you get it scheduled when you are ready.    Be sure to check your insurance coverage so you understand how much it will cost.  It may be covered as a preventative service at no cost, but you should check your particular policy.     HPV (Human Papillomavirus) Vaccine: What You Need to Know 1. Why get vaccinated? HPV (Human papillomavirus) vaccine can prevent infection with some types of human papillomavirus. HPV infections can cause certain types of cancers including:  cervical, vaginal and vulvar cancers in women,  penile cancer in men, and  anal cancers in both men and women. HPV vaccine  prevents infection from the HPV types that cause over 90% of these cancers. HPV is spread through intimate skin-to-skin or sexual contact. HPV infections are so common that nearly all men and women will get at least one type of HPV at some time in their lives. Most HPV infections go away by themselves within 2 years. But sometimes HPV infections will last longer and can cause cancers later in life. 2. HPV vaccine HPV vaccine is routinely recommended for adolescents at 37 or 34 years of age to ensure they are protected before they are exposed to the virus. HPV vaccine may be given beginning at age 8 years, and as late as age 61 years. Most people older than 26 years will not benefit from HPV vaccination. Talk with your health care provider if you want more information. Most children who get the first dose before 65 years of age need 2 doses of HPV vaccine. Anyone who gets the first dose on or after 34 years of age, and younger people with certain immunocompromising conditions, need 3 doses. Your health care provider can give you more information. HPV vaccine may be given at the same time as other vaccines. 3. Talk with your health care provider Tell your vaccine provider if the person getting the vaccine:  Has had an allergic reaction after a previous dose of HPV vaccine, or has any severe, life-threatening allergies.  Is pregnant. In some cases, your health care provider may decide to postpone HPV vaccination to a future visit. People with minor illnesses, such as a cold, may be vaccinated. People who are moderately or severely ill should usually wait until they recover before getting HPV vaccine. Your health care provider can give you more information. 4. Risks of a vaccine reaction  Soreness, redness, or swelling where the shot is given can happen after HPV vaccine.  Fever or headache can happen after HPV vaccine. People sometimes faint after medical procedures, including vaccination. Tell  your provider if you feel dizzy or have vision changes or ringing in the ears. As with any medicine, there is a very remote chance of a vaccine causing a severe allergic reaction, other serious injury, or death. 5. What if there is a serious problem? An allergic reaction could occur after the vaccinated person leaves the clinic. If you see signs of a severe allergic reaction (hives, swelling of the face and throat, difficulty breathing, a fast heartbeat, dizziness, or weakness), call 9-1-1 and get the person to the nearest hospital. For other signs that concern you, call your health care provider. Adverse reactions should be reported to the Vaccine Adverse Event Reporting System (VAERS). Your health care provider will usually file this report, or you can do it yourself. Visit the VAERS website at www.vaers.SamedayNews.es or call (934) 820-0964. VAERS is only for reporting reactions, and VAERS staff do not give medical advice. 6. The National Vaccine Injury Compensation Program The [employer] Vaccine Injury Compensation Program (  VICP) is a federal program that was created to compensate people who may have been injured by certain vaccines. Visit the VICP website at www.hrsa.gov/vaccinecompensation or call 1-800-338-2382 to learn about the program and about filing a claim. There is a time limit to file a claim for compensation. 7. How can I learn more?  Ask your health care provider.  Call your local or state health department.  Contact the Centers for Disease Control and Prevention (CDC): ? Call 1-800-232-4636 (1-800-CDC-INFO) or ? Visit CDC's website at www.cdc.gov/vaccines Vaccine Information Statement HPV Vaccine (07/15/2018) This information is not intended to replace advice given to you by your health care provider. Make sure you discuss any questions you have with your health care provider. Document Revised: 12/22/2018 Document Reviewed: 04/14/2018 Elsevier Patient Education  2020 Elsevier Inc.  

## 2019-11-09 NOTE — Progress Notes (Signed)
34 y.o. G1P1001 Single African American female here for annual exam.    Low back pain with periods.  Not looking for treatment for this.   Same partner.  Declines STD testing.   Working from home.  PCP:   Kaleen Odea, MD   Patient's last menstrual period was 10/30/2019.     Period Cycle (Days): 30 Period Duration (Days): 5-6 Period Pattern: Regular Menstrual Flow: Moderate Menstrual Control: Tampon, Maxi pad Menstrual Control Change Freq (Hours): changes pad/tampon every 6 hours Dysmenorrhea: (!) Mild(lower back pain)     Sexually active: Yes.    The current method of family planning is condoms most of the time.    Exercising: No.  The patient does not participate in regular exercise at present. Smoker:  no  Health Maintenance: Pap:  09-18-2018 negative, HP HPV +, 16/18/45 negative          01-24-17 negative, HR HPV negative. History of abnormal Pap:  Yes, 2009/2010 hx LGSIL. Had colposcopy which revealed CIN II. No tx. TDaP:  ?2006- declines today  Gardasil:   No.   HIV: 06-24-2019 negative Hep C: 06-24-2019 negative  Screening Labs:  Hb today: PCP , Urine today: not collected   reports that she has never smoked. She has never used smokeless tobacco. She reports that she does not drink alcohol or use drugs.  Past Medical History:  Diagnosis Date  . 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 Alopecia totalis   . Alpha thalassemia trait   . Anxiety   . 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  Marland Kitchen Depression   . Dysmenorrhea   . 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 Thyroid disease    dx'd with Hashimoto's--sees Dr. Buddy Duty.  Status post radioactive iodine    Past Surgical History:  Procedure Laterality Date  . CESAREAN SECTION  2007  . HERNIA REPAIR  2009     Current Outpatient Medications  Medication Sig Dispense Refill  . ALPRAZolam (XANAX) 0.25 MG tablet Take 1 tablet by mouth as needed.    . Cholecalciferol (VITAMIN D3) 5000 units CAPS Take 1 capsule by mouth daily.    Marland Kitchen escitalopram (LEXAPRO) 10 MG tablet Take 10 mg by mouth.    . Iron-FA-B Cmp-C-Biot-Probiotic (FUSION PLUS) CAPS     . levothyroxine (SYNTHROID, LEVOTHROID) 100 MCG tablet Take 100 mcg by mouth daily before breakfast.     . Multiple Vitamin (MULTI-VITAMINS) TABS (Plexus XFactor Plus) Take 2 capsules once a day    . valACYclovir (VALTREX) 500 MG tablet Take 1 tablet (500 mg total) by mouth 2 (two) times daily. Take for 3 days as needed. (Patient taking differently: Take 500 mg by mouth as needed. Take for 3 days as needed.) 30 tablet 1   No current facility-administered medications for this visit.    Family History  Problem Relation Age of Onset  . Anxiety disorder Mother   . Thyroid disease Mother   . Hypertension Mother   . Thyroid disease Father   . Cancer Paternal Grandfather 71       dec--colon a  . Seizures Maternal Aunt        epilepsy  . Diabetes Maternal Grandmother     Review of Systems  Constitutional: Negative.   HENT:  Negative.   Eyes: Negative.   Respiratory: Negative.   Cardiovascular: Negative.   Gastrointestinal: Negative.   Endocrine: Negative.   Genitourinary: Negative.   Musculoskeletal: Negative.   Skin: Negative.   Allergic/Immunologic: Negative.   Neurological: Negative.   Hematological: Negative.   Psychiatric/Behavioral: Negative.     Exam:   BP 110/60 (BP Location: Right Arm, Patient Position: Sitting, Cuff Size: Normal)   Pulse 70   Temp (!) 97.1 F (36.2 C) (Skin)   Resp 14   Ht 5\' 6"  (1.676 m)   Wt 171 lb 8 oz (77.8 kg)   LMP 10/30/2019   BMI 27.68 kg/m     General appearance: alert, cooperative and appears stated age Head: normocephalic, without obvious abnormality, atraumatic Neck: no adenopathy, supple,  symmetrical, trachea midline and thyroid normal to inspection and palpation Lungs: clear to auscultation bilaterally Breasts: normal appearance, no masses or tenderness, No nipple retraction or dimpling, No nipple discharge or bleeding, No axillary adenopathy Heart: regular rate and rhythm Abdomen: soft, non-tender; no masses, no organomegaly Extremities: extremities normal, atraumatic, no cyanosis or edema Skin: skin color, texture, turgor normal. No rashes or lesions Lymph nodes: cervical, supraclavicular, and axillary nodes normal. Neurologic: grossly normal  Pelvic: External genitalia:  no lesions              No abnormal inguinal nodes palpated.              Urethra:  normal appearing urethra with no masses, tenderness or lesions              Bartholins and Skenes: normal                 Vagina: normal appearing vagina with normal color and discharge, no lesions              Cervix: no lesions              Pap taken: Yes.   Bimanual Exam:  Uterus:  normal size, contour, position, consistency, mobility, non-tender              Adnexa: no mass, fullness, tenderness               Chaperone was present for exam.  Assessment:   Well woman visit with normal exam. Hx positive HR HPV.  Prior CIN II, untreated.  Hx HSV 2.  Hashimoto's thyroiditis.  Alopecia.  Plan: Mammogram screening age 63. Self breast awareness reviewed. Pap and HR HPV as above. Guidelines for Calcium, Vitamin D, regular exercise program including cardiovascular and weight bearing exercise. Valtrex 500 mg po bid x 3 days prn. We discussed paps, HPV, colposcopy, and Gardasil.  She will consider Gardasil. Follow up annually and prn.    After visit summary provided.

## 2019-11-10 LAB — CYTOLOGY - PAP
Comment: NEGATIVE
Diagnosis: NEGATIVE
High risk HPV: POSITIVE — AB

## 2019-11-12 ENCOUNTER — Telehealth: Payer: Self-pay | Admitting: *Deleted

## 2019-11-12 DIAGNOSIS — R87810 Cervical high risk human papillomavirus (HPV) DNA test positive: Secondary | ICD-10-CM

## 2019-11-12 NOTE — Telephone Encounter (Signed)
Spoke to pt. Pt given results of pap per Dr Edward Jolly. Pt agreeable to colpo. Per pts work schedule, pt scheduled for 12/08/2019 at 4pm. Pt aware of call for benefits from business office. Pt instructed to take Motrin 800 mg with food and water one hour before procedure. Pt verbalized understanding.   Routing to Dr Edward Jolly for review and will close encounter.  Cc: Hedda Slade for precert. Orders placed.

## 2019-11-12 NOTE — Telephone Encounter (Signed)
Leda Min, RN  11/12/2019 11:51 AM EST    Left message to call Noreene Larsson, RN at [facility] [phone].

## 2019-11-12 NOTE — Telephone Encounter (Signed)
Patient returned call

## 2019-11-12 NOTE — Telephone Encounter (Signed)
-----   Message from Patton Salles, MD sent at 11/11/2019  5:15 PM EST ----- Please contact patient with results of pap and HR HPV.  Her HR HPV is positive and her cells are normal.  She does need a colposcopy with me.  I prepared her for this possibility at her visit.

## 2019-11-18 ENCOUNTER — Telehealth: Payer: Self-pay | Admitting: Obstetrics and Gynecology

## 2019-11-18 NOTE — Telephone Encounter (Signed)
Call placed to convey benefits for colposcopy. Spoke with the patient and conveyed the benefits. Patient understands/agreeable with the benefits. Patient is aware of the cancellation policy. Appointment scheduled 12/08/19.

## 2019-12-02 ENCOUNTER — Telehealth: Payer: Self-pay | Admitting: *Deleted

## 2019-12-02 NOTE — Telephone Encounter (Signed)
Left message to call Naif Alabi, RN at [facility] [phone].   

## 2019-12-02 NOTE — Telephone Encounter (Signed)
Needs to reschedule colpo that was previously scheduled for 12-08-19 with Dr Edward Jolly.

## 2019-12-03 NOTE — Telephone Encounter (Signed)
Spoke with patient. LMP 11/27/19, no contraceptive. Colpo r/s to 12/07/19 at 1pm with Dr. Edward Jolly. Advised patient to take Motrin 800 mg with food and water one hour before procedure. Advised if any chance of pregnancy prior to appt, call office to notify. Patient verbalizes understanding and sis agreeable.   Routing to provider for final review. Patient is agreeable to disposition. Will close encounter.  Cc: Webb Silversmith, [place] [place]

## 2019-12-03 NOTE — Telephone Encounter (Signed)
Patient is returning a call to Jill. °

## 2019-12-07 ENCOUNTER — Encounter: Payer: Self-pay | Admitting: Obstetrics and Gynecology

## 2019-12-07 ENCOUNTER — Other Ambulatory Visit: Payer: Self-pay

## 2019-12-07 ENCOUNTER — Ambulatory Visit (INDEPENDENT_AMBULATORY_CARE_PROVIDER_SITE_OTHER): Payer: BC Managed Care – PPO | Admitting: Obstetrics and Gynecology

## 2019-12-07 ENCOUNTER — Other Ambulatory Visit (HOSPITAL_COMMUNITY)
Admission: RE | Admit: 2019-12-07 | Discharge: 2019-12-07 | Disposition: A | Discharge status: Home / Self Care | Payer: BC Managed Care – PPO | Source: Ambulatory Visit | Attending: Obstetrics and Gynecology | Admitting: Obstetrics and Gynecology

## 2019-12-07 VITALS — BP 126/60 | HR 100 | Temp 97.7°F | Ht 66.0 in | Wt 174.4 lb

## 2019-12-07 DIAGNOSIS — Z01812 Encounter for preprocedural laboratory examination: Secondary | ICD-10-CM | POA: Diagnosis not present

## 2019-12-07 DIAGNOSIS — R87810 Cervical high risk human papillomavirus (HPV) DNA test positive: Secondary | ICD-10-CM

## 2019-12-07 LAB — POCT URINE PREGNANCY: Preg Test, Ur: NEGATIVE

## 2019-12-07 NOTE — Progress Notes (Signed)
  Subjective:     Patient ID: Kristina Arnold, female   DOB: 14-Jul-1986, 34 y.o.   MRN: [id number]  HPI  Patient here today for colposcopy with pap on 11-09-19 Neg:Pos HR HPV.  Pap history:  -09-18-2018 negative, HP HPV +, 16/18/45 negative  -01-24-17 negative, HR HPV negative  Pap HGSIL 04/06/08.  Colposcopy 05/04/08 - LGSIL.  Multiple paps following this showed LGSIL.  Colposcopy in 2010 showed CIN II, neg ECC.  No tx.   Review of Systems  All other systems reviewed and are negative.   LMP: 11-27-19 Contraception: Condoms sometimes UPT: Neg  Past Medical History:  Diagnosis Date  . 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 Alopecia totalis   . Alpha thalassemia trait   . Anxiety   . 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  Marland Kitchen Depression   . Dysmenorrhea   . 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 Thyroid disease    dx'd with Hashimoto's--sees Dr. Sharl Ma.  Status post radioactive iodine   Past Surgical History:  Procedure Laterality Date  . CESAREAN SECTION  2007  . HERNIA REPAIR  2009   Current Outpatient Medications on File Prior to Visit  Medication Sig Dispense Refill  . ALPRAZolam (XANAX) 0.25 MG tablet Take 1 tablet by mouth as needed.    . Cholecalciferol (VITAMIN D3) 5000 units CAPS Take 1 capsule by mouth daily.    Marland Kitchen escitalopram (LEXAPRO) 10 MG tablet Take 10 mg by mouth.    . Iron-FA-B Cmp-C-Biot-Probiotic (FUSION PLUS) CAPS     . levothyroxine (SYNTHROID, LEVOTHROID) 100 MCG tablet Take 100 mcg by mouth daily before breakfast.     . Multiple Vitamin (MULTI-VITAMINS) TABS (Plexus XFactor Plus) Take 2 capsules once a day    . valACYclovir (VALTREX) 500 MG tablet Take 1 tablet (500 mg total) by mouth 2 (two) times daily. Take for 3 days as needed. 30  tablet 1   No current facility-administered medications on file prior to visit.   No Known Allergies      Objective:   Physical Exam Genitourinary:     Colposcopy - cervix, vagina. Consent for procedure.  3% acetic acid used in vagina and on cervix.  White light and green light filter used.  Colposcopy satisfactory:  Yes   _x____          No    _____ Findings:    Cervix:  No acetowhite lesions.  Vagina:  No acetowhite lesions.  Biopsies:   ECC to pathology.  Minimal EBL. No complications.      Assessment:     Positive HR HPV.     Plan:     FU ECC.  We reviewed her pap and colposcopy history in lab results in Epic.  Anticipate cervical cancer screening in one year for her annual exam.

## 2019-12-07 NOTE — Patient Instructions (Signed)
Colposcopy, Care After This sheet gives you information about how to care for yourself after your procedure. Your doctor may also give you more specific instructions. If you have problems or questions, contact your doctor. What can I expect after the procedure? If you did not have a tissue sample removed (did not have a biopsy), you may only have some spotting for a few days. You can go back to your normal activities. If you had a tissue sample removed, it is common to have:  Soreness and pain. This may last for a few days.  Light-headedness.  Mild bleeding from your vagina or dark-colored, grainy discharge from your vagina. This may last for a few days. You may need to wear a sanitary pad.  Spotting for at least 48 hours after the procedure. Follow these instructions at home:   Take over-the-counter and prescription medicines only as told by your doctor. Ask your doctor what medicines you can start taking again. This is very important if you take blood-thinning medicine.  Do not drive or use heavy machinery while taking prescription pain medicine.  For 3 days, or as long as your doctor tells you, avoid: ? Douching. ? Using tampons. ? Having sex.  If you use birth control (contraception), keep using it.  Limit activity for the first day after the procedure. Ask your doctor what activities are safe for you.  It is up to you to get the results of your procedure. Ask your doctor when your results will be ready.  Keep all follow-up visits as told by your doctor. This is important. Contact a doctor if:  You get a skin rash. Get help right away if:  You are bleeding a lot from your vagina. It is a lot of bleeding if you are using more than one pad an hour for 2 hours in a row.  You have clumps of blood (blood clots) coming from your vagina.  You have a fever.  You have chills  You have pain in your lower belly (pelvic area).  You have signs of infection, such as vaginal  discharge that is: ? Different than usual. ? Yellow. ? Bad-smelling.  You have very pain or cramps in your lower belly that do not get better with medicine.  You feel light-headed.  You feel dizzy.  You pass out (faint). Summary  If you did not have a tissue sample removed (did not have a biopsy), you may only have some spotting for a few days. You can go back to your normal activities.  If you had a tissue sample removed, it is common to have mild pain and spotting for 48 hours.  For 3 days, or as long as your doctor tells you, avoid douching, using tampons and having sex.  Get help right away if you have bleeding, very bad pain, or signs of infection. This information is not intended to replace advice given to you by your health care provider. Make sure you discuss any questions you have with your health care provider. Document Revised: 08/15/2017 Document Reviewed: 05/22/2016 Elsevier Patient Education  2020 Elsevier Inc.  

## 2019-12-08 ENCOUNTER — Ambulatory Visit: Payer: Self-pay | Admitting: Obstetrics and Gynecology

## 2019-12-09 ENCOUNTER — Telehealth: Payer: Self-pay | Admitting: Obstetrics and Gynecology

## 2019-12-09 LAB — SURGICAL PATHOLOGY

## 2019-12-09 NOTE — Telephone Encounter (Signed)
Spoke with patient, advised of results as seen below per Dr. Edward Jolly.  LMP 11/27/19. Repeat ECC scheduled for 01/13/20 at 1pm with Dr. Edward Jolly.   66mo recall placed.   Routing to provider for final review. Patient is agreeable to disposition. Will close encounter.

## 2019-12-09 NOTE — Telephone Encounter (Signed)
Patient has a question regarding her test results.

## 2019-12-09 NOTE — Telephone Encounter (Signed)
-----   Message from Patton Salles, MD sent at 12/09/2019 11:53 AM EDT ----- Please contact patient with results of ECC from colposcopy.  She had some inflammatory cells, which is a nonspecific finding.  No mucosa was identified, so the evaluation was limited.  I recommend she have a repeat ECC with me in 1 month, when she is not on her period.   (Please place her in recall for this as well.) No colposcopy is needed at that time.  Please schedule this appointment with me.

## 2020-01-13 ENCOUNTER — Ambulatory Visit: Payer: Self-pay | Admitting: Obstetrics and Gynecology

## 2020-03-03 ENCOUNTER — Telehealth: Payer: Self-pay

## 2020-03-03 NOTE — Telephone Encounter (Signed)
Left message to call Kristina Arnold at [phone].  Patient is due for 1 month follow up ECC with Dr.Silva. Patient cancelled her appointment for this on 01/13/2020 with Dr.Silva. Call to patient to assist with rescheduling.

## 2020-04-14 NOTE — Telephone Encounter (Signed)
Left message to call Kristina Arnold at [phone]. 

## 2020-04-26 ENCOUNTER — Telehealth: Payer: Self-pay | Admitting: *Deleted

## 2020-04-26 NOTE — Telephone Encounter (Signed)
Message left to return call to [facility] at [phone].   Patient in recall for 04/21 for f/u ECC with Dr. Edward Jolly. Patient needs to schedule OV for ECC.

## 2020-04-28 NOTE — Telephone Encounter (Signed)
Dr.Silva, unable to reach patient regarding rescheduling her 1 month follow up appointment for ECC. Please advise next steps.

## 2020-04-28 NOTE — Telephone Encounter (Signed)
Please send letter recommending ECC for re-evaluation of her cervical health.

## 2020-05-01 NOTE — Telephone Encounter (Signed)
Letter to Dr.Silva for review. °

## 2020-05-01 NOTE — Telephone Encounter (Signed)
Letter mailed to patient's home address on file. Encounter closed. °

## 2020-06-22 NOTE — Telephone Encounter (Signed)
Message left to return call to Jamar Casagrande at [phone].    

## 2020-06-29 NOTE — Telephone Encounter (Signed)
Please send letter to have patient schedule appointment for ECC due to her positive HR HPV.  Her prior ECC revealed only mucous and not enough cellular material for evaluation.

## 2020-06-29 NOTE — Telephone Encounter (Signed)
Patient has been contacted x2 and has not returned call to schedule ECC. Please advise on recall.   Routing to provider for review.

## 2020-07-05 NOTE — Telephone Encounter (Signed)
Letter to Dr.Silva.

## 2020-07-05 NOTE — Telephone Encounter (Signed)
Routing to Nursing Supervisor to send letter.  °

## 2020-07-06 NOTE — Telephone Encounter (Signed)
Letter signed by Dr.Silva and mailed to patient's home address on file. Encounter closed.

## 2020-08-16 DIAGNOSIS — U071 COVID-19: Secondary | ICD-10-CM

## 2020-08-16 HISTORY — DX: COVID-19: U07.1

## 2020-08-28 ENCOUNTER — Telehealth: Payer: Self-pay

## 2020-08-28 NOTE — Telephone Encounter (Signed)
Spoke with pt. Pt states needing to schedule repeat ECC per Dr Edward Jolly. Pt received letter stating need for OV from Oct 2021. Pt scheduled with Dr Edward Jolly on 12/28 at 430 pm as only appt available. Pt agreeable to date and time of appt. Pt advised can take Motrin 800 mg with food and water one hour before procedure. Pt verbalized understanding. Pt aware if heavy vaginal bleeding and if cycle occurs to call and reschedule. Pt agreeable. LMP 08/28/2020. Routing to Dr Edward Jolly for review Encounter closed    Patton Salles, MD  12/09/2019 11:53 AM EDT      Please contact patient with results of ECC from colposcopy.  She had some inflammatory cells, which is a nonspecific finding.  No mucosa was identified, so the evaluation was limited.  I recommend she have a repeat ECC with me in 1 month, when she is not on her period.  (Please place her in recall for this as well.) No colposcopy is needed at that time.  Please schedule this appointment with me.

## 2020-08-28 NOTE — Telephone Encounter (Signed)
Left message for pt to return call to triage RN. 

## 2020-08-28 NOTE — Telephone Encounter (Signed)
Patient is calling to schedule an appointment. Patient states she was called previously by nurse.

## 2020-09-11 ENCOUNTER — Telehealth: Payer: Self-pay

## 2020-09-11 NOTE — Telephone Encounter (Signed)
Patient left message to cancel repeat ECC due to having covid symptoms. Called patient to confirm, no answer left message.

## 2020-09-12 ENCOUNTER — Ambulatory Visit: Payer: Self-pay | Admitting: Obstetrics and Gynecology

## 2020-09-13 NOTE — Telephone Encounter (Signed)
I will do her ECC at her annual exam appointment in February.  Please place her in recall for February, 2022.

## 2020-09-13 NOTE — Telephone Encounter (Signed)
Recall placed for 10/2020.   AEX appointment notes updated to include ECC  Encounter closed.

## 2020-11-09 ENCOUNTER — Ambulatory Visit: Payer: BC Managed Care – PPO | Admitting: Obstetrics and Gynecology

## 2020-11-13 ENCOUNTER — Encounter: Payer: Self-pay | Admitting: Obstetrics and Gynecology

## 2020-11-15 NOTE — Telephone Encounter (Signed)
Informed patient of Dr Rica Records answer on scheduling annual exam and colposcopy at the same time. Patient did not want to schedule colpo at this time.

## 2020-12-01 ENCOUNTER — Other Ambulatory Visit (HOSPITAL_COMMUNITY)
Admission: RE | Admit: 2020-12-01 | Discharge: 2020-12-01 | Disposition: A | Discharge status: Home / Self Care | Payer: BC Managed Care – PPO | Source: Ambulatory Visit | Attending: Obstetrics and Gynecology | Admitting: Obstetrics and Gynecology

## 2020-12-01 ENCOUNTER — Encounter: Payer: Self-pay | Admitting: Obstetrics and Gynecology

## 2020-12-01 ENCOUNTER — Other Ambulatory Visit: Payer: Self-pay

## 2020-12-01 ENCOUNTER — Ambulatory Visit: Payer: BC Managed Care – PPO | Admitting: Obstetrics and Gynecology

## 2020-12-01 VITALS — BP 100/60 | HR 106 | Ht 65.5 in | Wt 162.0 lb

## 2020-12-01 DIAGNOSIS — Z01419 Encounter for gynecological examination (general) (routine) without abnormal findings: Secondary | ICD-10-CM | POA: Insufficient documentation

## 2020-12-01 MED ORDER — VALACYCLOVIR HCL 500 MG PO TABS: 500.0000 mg | ORAL_TABLET | Freq: Two times a day (BID) | ORAL | 2 refills | Status: DC

## 2020-12-01 NOTE — Patient Instructions (Signed)

## 2020-12-01 NOTE — Progress Notes (Signed)
35 y.o. G1P1001 Single African American female here for annual exam. Patient would like refill of Valtrex.    No period problems.   Declines STD testing.   Had colposcopy last year for positive HR HPV.  ECC was nondiagnostic.   Did not do Covid vaccine.  Had Covid in December.  PCP:  Raenette Rover, MD - [facility].  Patient's last menstrual period was 11/07/2020.           Sexually active: Yes.    The current method of family planning is condoms most of the time.    Exercising: Yes.    gym workouts Smoker:  no  Health Maintenance: Pap:   11/09/19 Neg:Pos HR HPV  09/18/18 Neg:Pos HR HPV  01/24/17 Neg:Neg HR HPV History of abnormal Pap:  yes TDaP:  Not UTD Gardasil:   no HIV: 06/24/19 Neg Hep C: 06/24/19  Screening Labs:  Discuss today   reports that she has never smoked. She has never used smokeless tobacco. She reports that she does not drink alcohol and does not use drugs.  Past Medical History:  Diagnosis Date  . 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 Alopecia totalis   . Alpha thalassemia trait   . Anxiety   . 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  Marland Kitchen COVID 08/2020  . Depression   . Dysmenorrhea   . 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 Thyroid disease    dx'd with Hashimoto's--sees Dr. Sharl Ma.  Status post radioactive iodine    Past Surgical History:  Procedure Laterality Date  . CESAREAN SECTION  2007  . HERNIA REPAIR  2009    Current Outpatient Medications  Medication Sig Dispense Refill  . ALPRAZolam (XANAX) 0.25 MG tablet Take 1 tablet by mouth as needed.    . Cholecalciferol (VITAMIN D3) 5000 units CAPS Take 1 capsule by mouth daily.    Marland Kitchen escitalopram (LEXAPRO) 10 MG tablet Take 10 mg by mouth.    . Iron-FA-B Cmp-C-Biot-Probiotic  (FUSION PLUS) CAPS     . levothyroxine (SYNTHROID) 112 MCG tablet Take 112 mcg by mouth daily before breakfast.    . Multiple Vitamin (MULTI-VITAMINS) TABS (Plexus XFactor Plus) Take 2 capsules once a day    . valACYclovir (VALTREX) 500 MG tablet Take 1 tablet (500 mg total) by mouth 2 (two) times daily. Take for 3 days as needed. 30 tablet 2   No current facility-administered medications for this visit.    Family History  Problem Relation Age of Onset  . Anxiety disorder Mother   . Thyroid disease Mother   . Hypertension Mother   . Thyroid disease Father   . Cancer Paternal Grandfather 60       dec--colon a  . Seizures Maternal Aunt        epilepsy  . Diabetes Maternal Grandmother     Review of Systems  Constitutional: Negative.   HENT: Negative.   Eyes: Negative.   Respiratory: Negative.   Cardiovascular: Negative.   Gastrointestinal: Negative.   Endocrine: Negative.   Genitourinary: Negative.   Musculoskeletal: Negative.   Skin: Negative.   Allergic/Immunologic: Negative.   Neurological: Negative.   Hematological: Negative.   Psychiatric/Behavioral: Negative.     Exam:   BP 100/60   Pulse Marland Kitchen)  106   Ht 5' 5.5" (1.664 m)   Wt 162 lb (73.5 kg)   LMP 11/07/2020   SpO2 100%   BMI 26.55 kg/m     General appearance: alert, cooperative and appears stated age Head: normocephalic, without obvious abnormality, atraumatic Neck: no adenopathy, supple, symmetrical, trachea midline and thyroid normal to inspection and palpation Lungs: clear to auscultation bilaterally Breasts: normal appearance, no masses or tenderness, No nipple retraction or dimpling, No nipple discharge or bleeding, No axillary adenopathy Heart: regular rate and rhythm Abdomen: soft, non-tender; no masses, no organomegaly Extremities: extremities normal, atraumatic, no cyanosis or edema Skin: skin color, texture, turgor normal. No rashes or lesions Lymph nodes: cervical, supraclavicular, and axillary  nodes normal. Neurologic: grossly normal  Pelvic: External genitalia:  no lesions              No abnormal inguinal nodes palpated.              Urethra:  normal appearing urethra with no masses, tenderness or lesions              Bartholins and Skenes: normal                 Vagina: normal appearing vagina with normal color and discharge, no lesions              Cervix: no lesions              Pap taken: Yes.   Bimanual Exam:  Uterus:  normal size, contour, position, consistency, mobility, non-tender              Adnexa: no mass, fullness, tenderness               Chaperone was present for exam.  Assessment:   Well woman visit with normal exam. Hx positive HR HPV.  Prior CIN II, untreated.  Hx HSV 2.  Hashimoto's thyroiditis.  Alopecia.  Plan: Mammogram screening discussed. Self breast awareness reviewed. Pap and HR HPV done today.   No ECC collected today.  Will wait for Pap and HR HPV testing to determine next steps in patient's care. Guidelines for Calcium, Vitamin D, regular exercise program including cardiovascular and weight bearing exercise. Refill of Valtrex to take prn.  Labs with PCP.  Follow up annually and prn.

## 2020-12-05 LAB — CYTOLOGY - PAP
Comment: NEGATIVE
High risk HPV: POSITIVE — AB

## 2020-12-19 ENCOUNTER — Ambulatory Visit: Payer: BC Managed Care – PPO | Admitting: Obstetrics and Gynecology

## 2021-03-15 NOTE — Progress Notes (Signed)
  Subjective:     Patient ID: Kristina Arnold, female   DOB: Jan 16, 1986, 35 y.o.   MRN: [id number]  HPI Patient here today for colposcopy with pap 12-01-20 LGSIL:Pos HR HPV.  Pap history: 12-01-20 LGSIL:Pos HR HPV 12-07-19 colpo revealed some inflammation on ECC;no mucosa was identified. Rec.repeat ECC 47mo. 11-09-19 Neg:Pos HR HPV 09-18-18 Neg:Pos HR HPV; Neg 16/18/45 01-24-17 Neg:Neg HR HPV  Review of Systems LMP: 02-26-21 Contraception: Condoms most of time UPT: Neg     Objective:   Physical Exam  Colposcopy Consent for procedure.  Pap done today.  3% acetic acid used.  Vaginal walls covering cervical os.  Multiple speculums needed to be used to see the cervix and a large Q-Tip was used to hold the vaginal wall out of the way to do visualize the cervix.  Satisfactory colposcopy.   No lesions of cervix or vagina. Small cervical opening.  Os finder would not pass.  Cervix sterilized with betadine.  Tenaculum to anterior cervical lip. Os finder used to dilate the cervix.  ECC obtained with curette.  Tissue to pathology.  No complications.  Minimal EBL.     Assessment:     LGSIL pap and positive HR HPV.     Plan:     Fu pap and ECC result. Anticipate pap at annual exam in March, 2023.

## 2021-03-21 ENCOUNTER — Other Ambulatory Visit: Payer: Self-pay

## 2021-03-21 ENCOUNTER — Other Ambulatory Visit (HOSPITAL_COMMUNITY)
Admission: RE | Admit: 2021-03-21 | Discharge: 2021-03-21 | Disposition: A | Discharge status: Home / Self Care | Payer: BC Managed Care – PPO | Source: Ambulatory Visit | Attending: Obstetrics and Gynecology | Admitting: Obstetrics and Gynecology

## 2021-03-21 ENCOUNTER — Ambulatory Visit: Payer: BC Managed Care – PPO | Admitting: Obstetrics and Gynecology

## 2021-03-21 VITALS — BP 122/58 | HR 96 | Ht 66.0 in | Wt 162.0 lb

## 2021-03-21 DIAGNOSIS — Z01812 Encounter for preprocedural laboratory examination: Secondary | ICD-10-CM

## 2021-03-21 DIAGNOSIS — R87612 Low grade squamous intraepithelial lesion on cytologic smear of cervix (LGSIL): Secondary | ICD-10-CM | POA: Insufficient documentation

## 2021-03-21 DIAGNOSIS — R87810 Cervical high risk human papillomavirus (HPV) DNA test positive: Secondary | ICD-10-CM | POA: Insufficient documentation

## 2021-03-21 LAB — PREGNANCY, URINE: Preg Test, Ur: NEGATIVE

## 2021-03-21 NOTE — Patient Instructions (Signed)
Colposcopy, Care After This sheet gives you information about how to care for yourself after your procedure. Your doctor may also give you more specific instructions. If youhave problems or questions, contact your doctor. What can I expect after the procedure? If you did not have a sample of your tissue taken out (did not have a biopsy), you may only have some spotting of blood for a few days. You can go back toyour normal activities. If you had a sample of your tissue taken out, it is common to have: Soreness and mild pain. These may last for a few days. A light-headed feeling. Mild bleeding or fluid (discharge) coming from your vagina. The fluid will look dark and grainy. You may have this for a few days. The fluid may be caused by a liquid that was used during your procedure. You may need to wear a sanitary pad. Spotting of blood for at least 48 hours after the procedure. Follow these instructions at home: Medicines Take over-the-counter and prescription medicines only as told by your doctor. Ask your doctor what medicines you can start taking again. This is very important if you take blood thinners. Activity Limit your activity for the first day after your procedure as told by your doctor. For at least 3 days, or for as long as told by your doctor, avoid: Douching. Using tampons. Having sex. Return to your normal activities as told by your doctor. Ask your doctor what activities are safe for you. General instructions  Drink enough fluid to keep your pee (urine) pale yellow. Ask your doctor if you may take baths, swim, or use a hot tub. You may take showers. If you use birth control (contraception), keep using it. Keep all follow-up visits as told by your doctor. This is important.  Contact a doctor if: You get a skin rash. Get help right away if: You bleed a lot from your vagina. A lot of bleeding means you use more than one pad an hour for 2 hours in a row. You have clumps of  blood (blood clots) coming from your vagina. You have a fever or chills. You have signs of infection. This may be fluid coming from your vagina that is: Different than normal. Yellow. Bad-smelling. You have very bad pain or cramps in your lower belly that do not get better with medicine. You faint. Summary If you did not have a sample of your tissue taken out, you may only have some spotting of blood for a few days. You can go back to your normal activities. If you had a sample of your tissue taken out, it is common to have mild pain for a few days and spotting for 48 hours. Avoid douching, using tampons, and having sex for at least 3 days after the procedure or for as long as told. Get help right away if you have a lot of bleeding, very bad pain, or signs of infection. This information is not intended to replace advice given to you by your health care provider. Make sure you discuss any questions you have with your healthcare provider. Document Revised: 07/04/2020 Document Reviewed: 09/01/2019 Elsevier Patient Education  2022 [employer] Inc.  

## 2021-03-22 ENCOUNTER — Encounter: Payer: Self-pay | Admitting: Obstetrics and Gynecology

## 2021-03-23 LAB — SURGICAL PATHOLOGY

## 2021-03-28 LAB — CYTOLOGY - PAP: Diagnosis: UNDETERMINED — AB

## 2021-12-06 ENCOUNTER — Ambulatory Visit: Payer: BC Managed Care – PPO | Admitting: Obstetrics and Gynecology

## 2022-02-22 ENCOUNTER — Ambulatory Visit: Payer: BC Managed Care – PPO | Admitting: Obstetrics and Gynecology

## 2023-02-25 NOTE — Progress Notes (Deleted)
37 y.o. G1P1001 Single African American female here for annual exam.    PCP:     No LMP recorded.           Sexually active: {yes no:314532}  The current method of family planning is {contraception:315051}.    Exercising: {yes no:314532}  {types:19826} Smoker:  no  Health Maintenance: Pap:  03/21/21 ASCUS, 12/01/20 LSIL: HR HPV positive History of abnormal Pap:  yes MMG:  n/a Colonoscopy:  n/a BMD:   n/a  Result  n/a TDaP:  ? Gardasil:   no HIV: 06/24/19 NR Hep C: 06/24/19 NR Screening Labs:  Hb today: ***, Urine today: ***   reports that she has never smoked. She has never used smokeless tobacco. She reports that she does not drink alcohol and does not use drugs.  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 Sharl Ma.  Status post radioactive iodine    Past Surgical History:  Procedure Laterality Date   CESAREAN SECTION  2007   HERNIA REPAIR  2009    Current Outpatient Medications  Medication Sig Dispense Refill   ALPRAZolam (XANAX) 0.25 MG tablet Take 1 tablet by mouth as needed.     Cholecalciferol (VITAMIN D3) 5000 units CAPS Take 1 capsule by mouth daily.     escitalopram (LEXAPRO) 20 MG tablet Take 20 mg by mouth daily.     Iron-FA-B Cmp-C-Biot-Probiotic (FUSION PLUS) CAPS      levothyroxine (SYNTHROID) 112 MCG tablet Take 112 mcg by mouth daily before breakfast.     Multiple Vitamin (MULTI-VITAMINS) TABS (Plexus XFactor Plus) Take 2 capsules once a day     valACYclovir (VALTREX) 500  MG tablet Take 1 tablet (500 mg total) by mouth 2 (two) times daily. Take for 3 days as needed. 30 tablet 2   No current facility-administered medications for this visit.    Family History  Problem Relation Age of Onset   Anxiety disorder Mother    Thyroid disease Mother    Hypertension Mother    Thyroid disease Father    Cancer Paternal Grandfather 30       dec--colon a   Seizures Maternal Aunt        epilepsy   Diabetes Maternal Grandmother     Review of Systems  Exam:   There were no vitals taken for this visit.    General appearance: alert, cooperative and appears stated age Head: normocephalic, without obvious abnormality, atraumatic Neck: no adenopathy, supple, symmetrical, trachea midline and thyroid normal to inspection and palpation Lungs: clear to auscultation bilaterally Breasts: normal appearance, no masses or tenderness, No nipple retraction or dimpling, No nipple discharge or bleeding, No axillary adenopathy Heart: regular rate and rhythm Abdomen: soft, non-tender; no masses, no organomegaly Extremities: extremities normal, atraumatic, no cyanosis or edema Skin: skin color, texture, turgor normal. No rashes or lesions Lymph nodes: cervical, supraclavicular, and axillary nodes normal. Neurologic: grossly normal  Pelvic: External genitalia:  no lesions              No abnormal inguinal nodes palpated.              Urethra:  normal appearing urethra with no masses, tenderness or lesions              Bartholins and Skenes: normal                 Vagina: normal appearing vagina with normal color and discharge, no lesions              Cervix: no lesions              Pap taken: {yes no:314532} Bimanual Exam:  Uterus:  normal size, contour, position, consistency, mobility, non-tender              Adnexa: no mass, fullness, tenderness              Rectal exam: {yes no:314532}.  Confirms.              Anus:  normal sphincter tone, no lesions  Chaperone was present  for exam:  ***  Assessment:   Well woman visit with gynecologic exam.   Plan: Mammogram screening discussed. Self breast awareness reviewed. Pap and HR HPV as above. Guidelines for Calcium, Vitamin D, regular exercise program including cardiovascular and weight bearing exercise.   Follow up annually and prn.   Additional counseling given.  {yes [id number]. _______ minutes face to face time of which over 50% was spent in counseling.    After visit summary provided.

## 2023-03-11 ENCOUNTER — Ambulatory Visit: Payer: BC Managed Care – PPO | Admitting: Obstetrics and Gynecology

## 2023-06-23 NOTE — Progress Notes (Deleted)
37 y.o. G1P1001 Single African American female here for annual exam.    PCP:     No LMP recorded.           Sexually active: {yes no:314532}  The current method of family planning is {contraception:315051}.    Exercising: {yes no:314532}  {types:19826} Smoker:  no  Health Maintenance: Pap:  11/09/19 Neg:Pos HR HPV             09/18/18 Neg:Pos HR HPV             01/24/17 Neg:Neg HR HPV History of abnormal Pap:  yes MMG:  n/a Colonoscopy:  n/a BMD:   n/a  Result  n/a TDaP:  unsure Gardasil:   no HIV: 06/24/19 neg Hep C: 06/24/19 neg Screening Labs:  Hb today: ***, Urine today: ***   reports that she has never smoked. She has never used smokeless tobacco. She reports that she does not drink alcohol and does not use drugs.  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 Sharl Ma.  Status post radioactive iodine    Past Surgical History:  Procedure Laterality Date   CESAREAN SECTION  2007   HERNIA REPAIR  2009    Current Outpatient Medications  Medication Sig Dispense Refill   ALPRAZolam (XANAX) 0.25 MG tablet Take 1 tablet by mouth as needed.     Cholecalciferol (VITAMIN D3) 5000 units CAPS Take 1 capsule by mouth daily.     escitalopram (LEXAPRO) 20 MG tablet Take 20 mg by mouth daily.     Iron-FA-B Cmp-C-Biot-Probiotic (FUSION PLUS) CAPS      levothyroxine (SYNTHROID) 112 MCG tablet Take 112 mcg by mouth daily before breakfast.     Multiple Vitamin (MULTI-VITAMINS) TABS (Plexus XFactor Plus)  Take 2 capsules once a day     valACYclovir (VALTREX) 500 MG tablet Take 1 tablet (500 mg total) by mouth 2 (two) times daily. Take for 3 days as needed. 30 tablet 2   No current facility-administered medications for this visit.    Family History  Problem Relation Age of Onset   Anxiety disorder Mother    Thyroid disease Mother    Hypertension Mother    Thyroid disease Father    Cancer Paternal Grandfather 56       dec--colon a   Seizures Maternal Aunt        epilepsy   Diabetes Maternal Grandmother     Review of Systems  Exam:   There were no vitals taken for this visit.    General appearance: alert, cooperative and appears stated age Head: normocephalic, without obvious abnormality, atraumatic Neck: no adenopathy, supple, symmetrical, trachea midline and thyroid normal to inspection and palpation Lungs: clear to auscultation bilaterally Breasts: normal appearance, no masses or tenderness, No nipple retraction or dimpling, No nipple discharge or bleeding, No axillary adenopathy Heart: regular rate and rhythm Abdomen: soft, non-tender; no masses, no organomegaly Extremities: extremities normal, atraumatic,  no cyanosis or edema Skin: skin color, texture, turgor normal. No rashes or lesions Lymph nodes: cervical, supraclavicular, and axillary nodes normal. Neurologic: grossly normal  Pelvic: External genitalia:  no lesions              No abnormal inguinal nodes palpated.              Urethra:  normal appearing urethra with no masses, tenderness or lesions              Bartholins and Skenes: normal                 Vagina: normal appearing vagina with normal color and discharge, no lesions              Cervix: no lesions              Pap taken: {yes no:314532} Bimanual Exam:  Uterus:  normal size, contour, position, consistency, mobility, non-tender              Adnexa: no mass, fullness, tenderness              Rectal exam: {yes no:314532}.  Confirms.              Anus:   normal sphincter tone, no lesions  Chaperone was present for exam:  ***  Assessment:   Well woman visit with gynecologic exam.   Plan: Mammogram screening discussed. Self breast awareness reviewed. Pap and HR HPV as above. Guidelines for Calcium, Vitamin D, regular exercise program including cardiovascular and weight bearing exercise.   Follow up annually and prn.   Additional counseling given.  {yes [id number]. _______ minutes face to face time of which over 50% was spent in counseling.    After visit summary provided.

## 2023-06-26 ENCOUNTER — Encounter: Payer: Self-pay | Admitting: Obstetrics and Gynecology

## 2023-06-27 MED ORDER — VALACYCLOVIR HCL 500 MG PO TABS: 500.0000 mg | ORAL_TABLET | Freq: Two times a day (BID) | ORAL | 0 refills | Status: DC

## 2023-06-27 NOTE — Telephone Encounter (Signed)
Med refill request: valacyclovir 500 mg tab. Take PO bid for 3 days PRn  Last AEX: 12/01/20 -BS Next AEX: 07/07/23 -BS Last MMG (if hormonal med) N/A  Hx HSV 2  Routing to covering provider to review.  Rx pended #30/0RF.    Refill authorized: Please Advise?

## 2023-07-07 ENCOUNTER — Ambulatory Visit: Payer: No Typology Code available for payment source | Admitting: Obstetrics and Gynecology

## 2024-05-21 NOTE — Progress Notes (Unsigned)
 38 y.o. G1P1001 Single African American female here for annual exam. Had pap done 2024 in [place], abnormal, they wanted to do colpo but did not do it.  Wants STD screening.  Wants vaginitis testing.    No period concerns.    PCP: Godwin Shed, MD   Patient's last menstrual period was 05/13/2024 (exact date).     Period Cycle (Days): 28 Period Duration (Days): 5 Period Pattern: Regular Menstrual Flow: Moderate Menstrual Control: Maxi pad, Tampon Dysmenorrhea: (!) Moderate Dysmenorrhea Symptoms: Cramping     Sexually active: Yes.    The current method of family planning is none.   Will use condoms.   Menopausal hormone therapy:  n/a Exercising: No.   Smoker:  no  OB History  Gravida Para Term Preterm AB Living  1 1 1   1   SAB IAB Ectopic Multiple Live Births          # Outcome Date GA Lbr Len/2nd Weight Sex Type Anes PTL Lv  1 Term              HEALTH MAINTENANCE: Last 2 paps:  06/11/23 ASCUS, HR HPV + (Care everywhere) 03/21/21 ASCUS, 12/01/20 ASCUS, HR HPV + History of abnormal Pap or positive HPV:  yes.  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 Mammogram:   n/a Colonoscopy:  n/a Bone Density:  n/a  Result  n/a   Immunization History  Administered Date(s) Administered   Influenza,inj,quad, With Preservative 07/17/2014   Influenza-Unspecified 07/17/2014      reports that she has never smoked. She has never used smokeless tobacco. She reports that she does not drink alcohol and does not use drugs.  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 Faythe.  Status post radioactive iodine    Past Surgical History:  Procedure Laterality Date   CESAREAN SECTION  2007   HERNIA REPAIR  2009    Current Outpatient Medications  Medication Sig Dispense Refill   ALPRAZolam (XANAX) 0.25 MG tablet Take 1 tablet by mouth as needed.     Cholecalciferol (VITAMIN D3) 5000 units CAPS Take 1 capsule by mouth daily.     escitalopram (LEXAPRO) 20 MG tablet Take 20 mg by mouth daily.     Iron -FA-B Cmp-C-Biot-Probiotic (FUSION PLUS) CAPS      levothyroxine (SYNTHROID) 100 MCG tablet TAKE 1 TABLET ( ) DAILY FOR HYPOTHYROIDISM     Multiple Vitamin (MULTI-VITAMINS) TABS (Plexus XFactor Plus) Take 2 capsules once a day     Omega-3 Fatty Acids (THE VERY FINEST FISH OIL) LIQD      valACYclovir  (VALTREX ) 500 MG tablet Take 1 tablet (500 mg total) by mouth 2 (two) times daily. Take for 3 days as needed. 30 tablet 0   No current facility-administered medications for this visit.    ALLERGIES: Patient has no known allergies.  Family History  Problem Relation Age of Onset  Anxiety disorder Mother    Thyroid  disease Mother    Hypertension Mother    Thyroid  disease Father    Cancer Paternal Grandfather 61       dec--colon a   Seizures Maternal Aunt        epilepsy   Diabetes Maternal Grandmother     Review of Systems  All other systems reviewed and are negative.   PHYSICAL EXAM:  BP 116/82 (BP Location: Left Arm, Patient Position: Sitting)   Pulse 81   Ht 5' 6.25 (1.683 m)   Wt 172 lb (78 kg)   LMP 05/13/2024 (Exact Date)   SpO2 99%   BMI 27.55 kg/m     General appearance: alert, cooperative and appears stated age Head: normocephalic, without obvious abnormality, atraumatic Neck: no adenopathy, supple, symmetrical, trachea midline and thyroid  normal to inspection and palpation Lungs: clear to  auscultation bilaterally Breasts: normal appearance, no masses or tenderness, No nipple retraction or dimpling, No nipple discharge or bleeding, No axillary adenopathy Heart: regular rate and rhythm Abdomen: soft, non-tender; no masses, no organomegaly Extremities: extremities normal, atraumatic, no cyanosis or edema Skin: skin color, texture, turgor normal. No rashes or lesions Lymph nodes: cervical, supraclavicular, and axillary nodes normal. Neurologic: grossly normal  Pelvic: External genitalia:  no lesions              No abnormal inguinal nodes palpated.              Urethra:  normal appearing urethra with no masses, tenderness or lesions              Bartholins and Skenes: normal                 Vagina: normal appearing vagina with normal color and discharge, no lesions              Cervix: no lesions              Pap taken: {yes no:314532} Bimanual Exam:  Uterus:  normal size, contour, position, consistency, mobility, non-tender              Adnexa: no mass, fullness, tenderness              Rectal exam: {yes no:314532}.  Confirms.              Anus:  normal sphincter tone, no lesions  Chaperone was present for exam:  jada m, cma  ASSESSMENT: Well woman visit with gynecologic exam. Hx positive HR HPV.  Prior CIN II, untreated.  Hx HSV 2.  Hashimoto's thyroiditis.  Alopecia. Hx anxiety and depression.  On Lexapro.   PHQ-2-9: 0   PLAN: Mammogram screening discussed. Self breast awareness reviewed. Pap and HRV collected:  {yes no:314532} Guidelines for Calcium, Vitamin D, regular exercise program including cardiovascular and weight bearing exercise. Medication refills:  Valtrex  {LABS (Optional):23779} Follow up:  ***    Additional counseling given.  {yes [id number]. ***  total time was spent for this patient encounter, including preparation, face-to-face counseling with the patient, coordination of care, and documentation of the encounter in addition to doing the  well woman visit with gynecologic exam.

## 2024-05-25 ENCOUNTER — Other Ambulatory Visit (HOSPITAL_COMMUNITY)
Admission: RE | Admit: 2024-05-25 | Discharge: 2024-05-25 | Disposition: A | Discharge status: Home / Self Care | Source: Ambulatory Visit | Attending: Obstetrics and Gynecology | Admitting: Obstetrics and Gynecology

## 2024-05-25 ENCOUNTER — Encounter: Payer: Self-pay | Admitting: Obstetrics and Gynecology

## 2024-05-25 ENCOUNTER — Ambulatory Visit (INDEPENDENT_AMBULATORY_CARE_PROVIDER_SITE_OTHER): Admitting: Obstetrics and Gynecology

## 2024-05-25 VITALS — BP 116/82 | HR 81 | Ht 66.25 in | Wt 172.0 lb

## 2024-05-25 DIAGNOSIS — Z01419 Encounter for gynecological examination (general) (routine) without abnormal findings: Secondary | ICD-10-CM | POA: Diagnosis present

## 2024-05-25 DIAGNOSIS — N760 Acute vaginitis: Secondary | ICD-10-CM | POA: Insufficient documentation

## 2024-05-25 DIAGNOSIS — Z124 Encounter for screening for malignant neoplasm of cervix: Secondary | ICD-10-CM

## 2024-05-25 DIAGNOSIS — Z1331 Encounter for screening for depression: Secondary | ICD-10-CM | POA: Diagnosis not present

## 2024-05-25 DIAGNOSIS — Z113 Encounter for screening for infections with a predominantly sexual mode of transmission: Secondary | ICD-10-CM

## 2024-05-25 DIAGNOSIS — Z1151 Encounter for screening for human papillomavirus (HPV): Secondary | ICD-10-CM | POA: Diagnosis not present

## 2024-05-25 DIAGNOSIS — Z1159 Encounter for screening for other viral diseases: Secondary | ICD-10-CM

## 2024-05-25 DIAGNOSIS — Z114 Encounter for screening for human immunodeficiency virus [HIV]: Secondary | ICD-10-CM

## 2024-05-25 MED ORDER — VALACYCLOVIR HCL 500 MG PO TABS: 500.0000 mg | ORAL_TABLET | Freq: Two times a day (BID) | ORAL | 2 refills | Status: AC

## 2024-05-25 NOTE — Patient Instructions (Signed)

## 2024-05-26 ENCOUNTER — Ambulatory Visit: Payer: Self-pay | Admitting: Obstetrics and Gynecology

## 2024-05-26 LAB — CERVICOVAGINAL ANCILLARY ONLY
Bacterial Vaginitis (gardnerella): POSITIVE — AB
Candida Glabrata: NEGATIVE
Candida Vaginitis: NEGATIVE
Comment: NEGATIVE
Comment: NEGATIVE
Comment: NEGATIVE

## 2024-05-27 LAB — CYTOLOGY - PAP
Adequacy: ABSENT
Chlamydia: NEGATIVE
Comment: NEGATIVE
Comment: NEGATIVE
Comment: NEGATIVE
Comment: NORMAL
High risk HPV: POSITIVE — AB
Neisseria Gonorrhea: NEGATIVE
Trichomonas: NEGATIVE

## 2024-05-27 LAB — HIV ANTIBODY (ROUTINE TESTING W REFLEX)
HIV 1&2 Ab, 4th Generation: NONREACTIVE
HIV FINAL INTERPRETATION: NEGATIVE

## 2024-05-27 LAB — HEPATITIS C ANTIBODY: Hepatitis C Ab: NONREACTIVE

## 2024-05-27 LAB — RPR: RPR Ser Ql: NONREACTIVE

## 2024-05-27 MED ORDER — METRONIDAZOLE 0.75 % VA GEL: 1.0000 | Freq: Every day | VAGINAL | 0 refills | Status: AC

## 2024-05-28 NOTE — Progress Notes (Signed)
 Recall placed

## 2024-06-05 ENCOUNTER — Encounter: Payer: Self-pay | Admitting: Obstetrics and Gynecology

## 2024-06-07 ENCOUNTER — Other Ambulatory Visit: Payer: Self-pay | Admitting: Obstetrics and Gynecology

## 2024-06-07 MED ORDER — FLUCONAZOLE 150 MG PO TABS: 150.0000 mg | ORAL_TABLET | Freq: Once | ORAL | 0 refills | Status: AC

## 2024-06-07 NOTE — Telephone Encounter (Signed)
Routing to Dr. Silva to review.  

## 2024-08-03 ENCOUNTER — Encounter: Admitting: Obstetrics and Gynecology

## 2024-09-22 ENCOUNTER — Encounter: Admitting: Obstetrics and Gynecology

## 2024-11-16 ENCOUNTER — Encounter: Admitting: Obstetrics and Gynecology
